# Patient Record
Sex: Female | Born: 1945 | Race: White | Hispanic: No | Smoking: Current some day smoker
Health system: Southern US, Community
[De-identification: ages and names within clinical notes are randomized; demographics above are authoritative.]

## PROBLEM LIST (undated history)

## (undated) DIAGNOSIS — K219 Gastro-esophageal reflux disease without esophagitis: Secondary | ICD-10-CM

## (undated) DIAGNOSIS — E785 Hyperlipidemia, unspecified: Secondary | ICD-10-CM

## (undated) DIAGNOSIS — I10 Essential (primary) hypertension: Secondary | ICD-10-CM

## (undated) DIAGNOSIS — J449 Chronic obstructive pulmonary disease, unspecified: Secondary | ICD-10-CM

## (undated) DIAGNOSIS — I2510 Atherosclerotic heart disease of native coronary artery without angina pectoris: Secondary | ICD-10-CM

## (undated) HISTORY — DX: Essential (primary) hypertension: I10

## (undated) HISTORY — DX: Hyperlipidemia, unspecified: E78.5

## (undated) HISTORY — DX: Chronic obstructive pulmonary disease, unspecified: J44.9

## (undated) HISTORY — DX: Gastro-esophageal reflux disease without esophagitis: K21.9

## (undated) HISTORY — DX: Atherosclerotic heart disease of native coronary artery without angina pectoris: I25.10

---

## 1998-01-02 ENCOUNTER — Inpatient Hospital Stay (HOSPITAL_COMMUNITY): Admission: EM | Admit: 1998-01-02 | Discharge: 1998-01-05 | Payer: Self-pay | Admitting: Cardiovascular Disease

## 1999-07-21 ENCOUNTER — Inpatient Hospital Stay (HOSPITAL_COMMUNITY): Admission: EM | Admit: 1999-07-21 | Discharge: 1999-07-24 | Payer: Self-pay | Admitting: Emergency Medicine

## 1999-07-21 ENCOUNTER — Encounter: Payer: Self-pay | Admitting: *Deleted

## 2001-02-12 ENCOUNTER — Ambulatory Visit (HOSPITAL_COMMUNITY): Admission: RE | Admit: 2001-02-12 | Discharge: 2001-02-12 | Payer: Self-pay | Admitting: Cardiology

## 2001-02-22 ENCOUNTER — Ambulatory Visit (HOSPITAL_COMMUNITY): Admission: RE | Admit: 2001-02-22 | Discharge: 2001-02-22 | Payer: Self-pay | Admitting: Internal Medicine

## 2001-02-22 ENCOUNTER — Encounter: Payer: Self-pay | Admitting: Internal Medicine

## 2001-03-08 ENCOUNTER — Ambulatory Visit (HOSPITAL_COMMUNITY): Admission: RE | Admit: 2001-03-08 | Discharge: 2001-03-08 | Payer: Self-pay | Admitting: Family Medicine

## 2001-03-08 ENCOUNTER — Encounter: Payer: Self-pay | Admitting: Family Medicine

## 2001-04-19 ENCOUNTER — Encounter: Admission: RE | Admit: 2001-04-19 | Discharge: 2001-07-18 | Payer: Self-pay | Admitting: Internal Medicine

## 2001-10-31 ENCOUNTER — Ambulatory Visit (HOSPITAL_COMMUNITY): Admission: RE | Admit: 2001-10-31 | Discharge: 2001-10-31 | Payer: Self-pay | Admitting: Family Medicine

## 2001-10-31 ENCOUNTER — Encounter: Payer: Self-pay | Admitting: Family Medicine

## 2002-01-18 ENCOUNTER — Ambulatory Visit (HOSPITAL_COMMUNITY): Admission: RE | Admit: 2002-01-18 | Discharge: 2002-01-18 | Payer: Self-pay | Admitting: Family Medicine

## 2002-01-18 ENCOUNTER — Encounter: Payer: Self-pay | Admitting: Family Medicine

## 2002-01-29 ENCOUNTER — Encounter: Payer: Self-pay | Admitting: Internal Medicine

## 2002-01-29 ENCOUNTER — Ambulatory Visit (HOSPITAL_COMMUNITY): Admission: RE | Admit: 2002-01-29 | Discharge: 2002-01-29 | Payer: Self-pay | Admitting: Internal Medicine

## 2002-02-01 ENCOUNTER — Ambulatory Visit (HOSPITAL_COMMUNITY): Admission: RE | Admit: 2002-02-01 | Discharge: 2002-02-01 | Payer: Self-pay | Admitting: Cardiology

## 2002-11-01 ENCOUNTER — Encounter: Payer: Self-pay | Admitting: Family Medicine

## 2002-11-01 ENCOUNTER — Ambulatory Visit (HOSPITAL_COMMUNITY): Admission: RE | Admit: 2002-11-01 | Discharge: 2002-11-01 | Payer: Self-pay | Admitting: Family Medicine

## 2002-12-17 ENCOUNTER — Inpatient Hospital Stay (HOSPITAL_COMMUNITY): Admission: EM | Admit: 2002-12-17 | Discharge: 2002-12-21 | Payer: Self-pay | Admitting: Emergency Medicine

## 2002-12-17 ENCOUNTER — Encounter: Payer: Self-pay | Admitting: Emergency Medicine

## 2003-03-13 ENCOUNTER — Ambulatory Visit (HOSPITAL_COMMUNITY): Admission: RE | Admit: 2003-03-13 | Discharge: 2003-03-13 | Payer: Self-pay | Admitting: Internal Medicine

## 2003-03-13 ENCOUNTER — Encounter: Payer: Self-pay | Admitting: Internal Medicine

## 2003-08-26 ENCOUNTER — Ambulatory Visit (HOSPITAL_COMMUNITY): Admission: RE | Admit: 2003-08-26 | Discharge: 2003-08-26 | Payer: Self-pay | Admitting: Family Medicine

## 2003-09-15 ENCOUNTER — Ambulatory Visit (HOSPITAL_COMMUNITY): Admission: RE | Admit: 2003-09-15 | Discharge: 2003-09-15 | Payer: Self-pay | Admitting: Thoracic Surgery

## 2003-11-19 ENCOUNTER — Encounter: Admission: RE | Admit: 2003-11-19 | Discharge: 2003-11-19 | Payer: Self-pay | Admitting: Thoracic Surgery

## 2004-01-11 ENCOUNTER — Emergency Department (HOSPITAL_COMMUNITY): Admission: EM | Admit: 2004-01-11 | Discharge: 2004-01-11 | Payer: Self-pay | Admitting: Emergency Medicine

## 2004-03-11 ENCOUNTER — Encounter: Admission: RE | Admit: 2004-03-11 | Discharge: 2004-03-11 | Payer: Self-pay | Admitting: Thoracic Surgery

## 2004-07-06 ENCOUNTER — Ambulatory Visit (HOSPITAL_COMMUNITY): Admission: RE | Admit: 2004-07-06 | Discharge: 2004-07-06 | Payer: Self-pay | Admitting: Family Medicine

## 2004-07-08 ENCOUNTER — Encounter: Admission: RE | Admit: 2004-07-08 | Discharge: 2004-07-08 | Payer: Self-pay | Admitting: Thoracic Surgery

## 2004-07-13 IMAGING — CT NM MISC PROCEDURE
3 series · 25 of 25 positions shown · IV contrast ([ID])
Comparison: No prior PET.  The CT chest from [HOSPITAL] 08/26/03 is correlated.

CLINICAL DATA: 57-year-old with a recent CT of the chest demonstrating a medial anterior right upper lobe lung nodule and slightly prominent right hilar lymph node.
([id number] for Medicare)

FDG PET-CT TUMOR IMAGING (SKULL BASE TO THIGHS)
TECHNIQUE: 16.7 mCi F-18 FDG were administered via a right hand vein.  Full ring PET imaging was performed from the skull base through the mid-thighs 70 minutes after injection.  CT data was obtained and used for attenuation correction and anatomic localization only.  (This was not acquired as a diagnostic CT examination.)

[Series 1: pet ac · axial · 3.3mm · 4.69mm/px · z∈[-880,-10]mm · 9 of 267 slices shown]
[im 1/267]
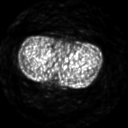
[im 34/267]
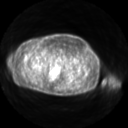
[im 67/267]
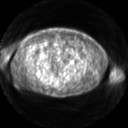
[im 100/267]
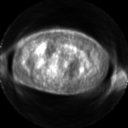
[im 134/267]
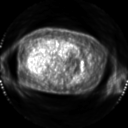
[im 167/267]
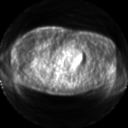
[im 200/267]
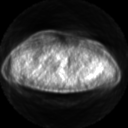
[im 233/267]
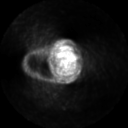
[im 267/267]
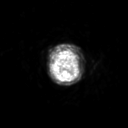

[Series 2: ct images · axial · 3.8mm · 0.98mm/px · z∈[-880,-10]mm · 8 of 267 slices shown]
[im 1/267  soft-tissue]
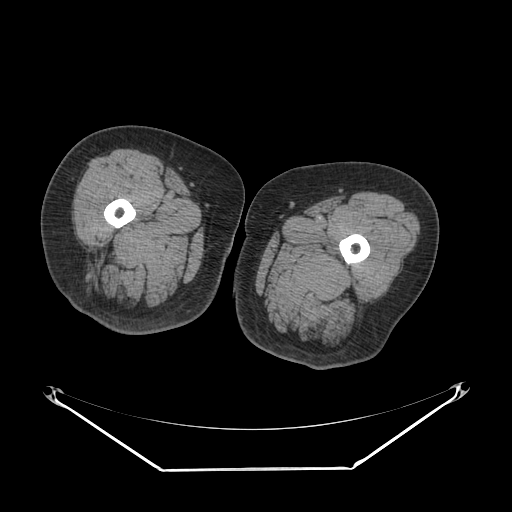
[im 39/267  soft-tissue]
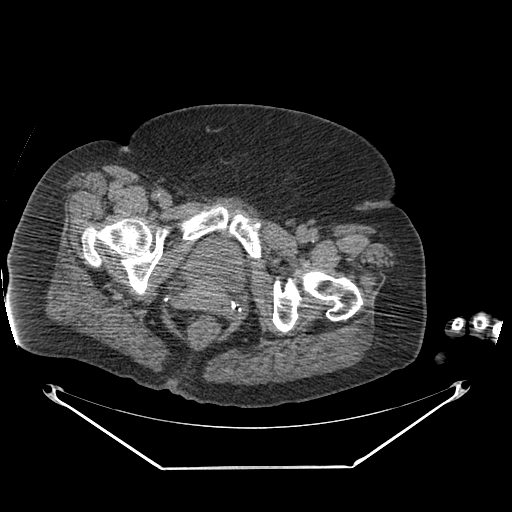
[im 77/267  soft-tissue]
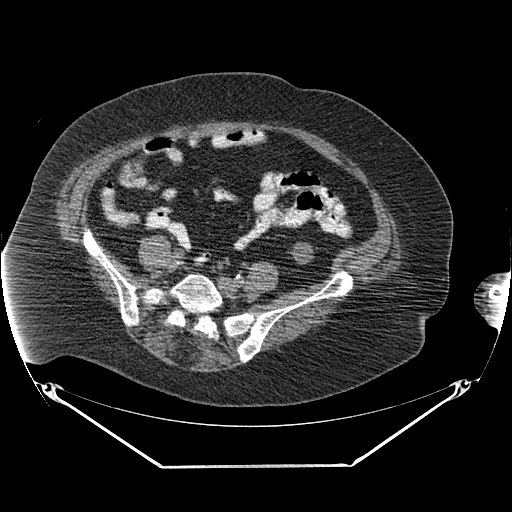
[im 115/267  soft-tissue]
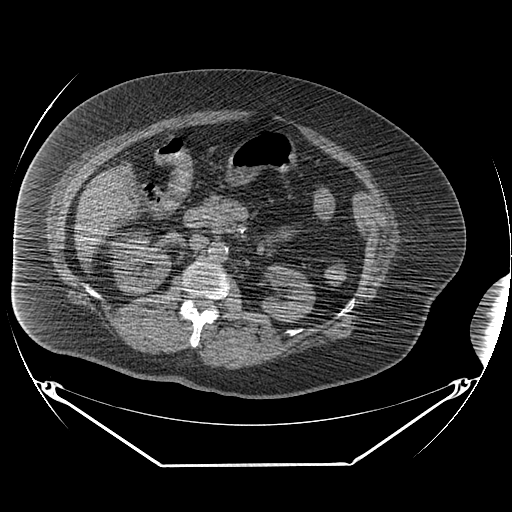
[im 153/267  soft-tissue]
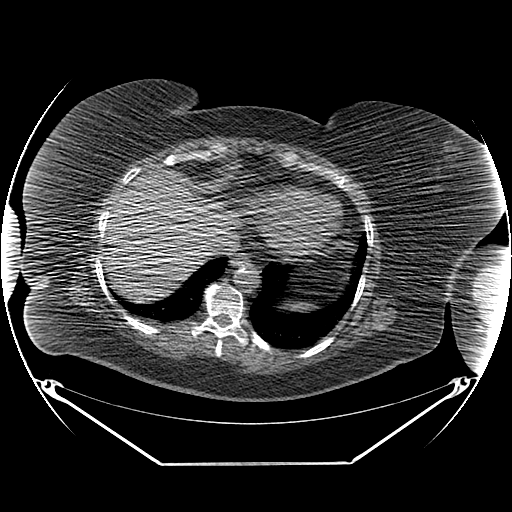
[im 191/267  soft-tissue]
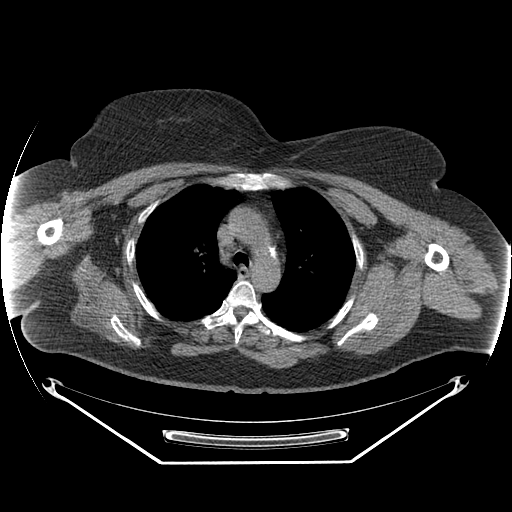
[im 229/267  soft-tissue]
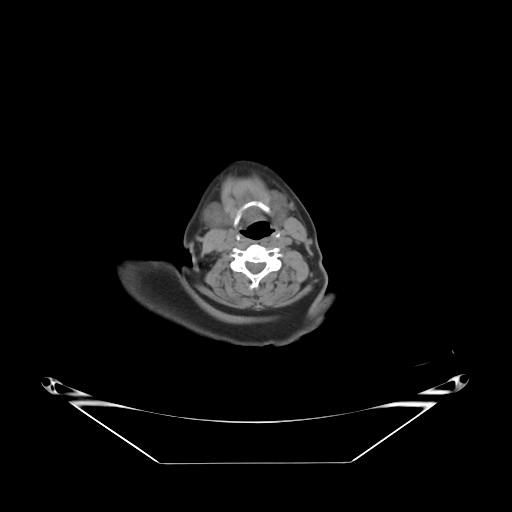
[im 267/267  soft-tissue]
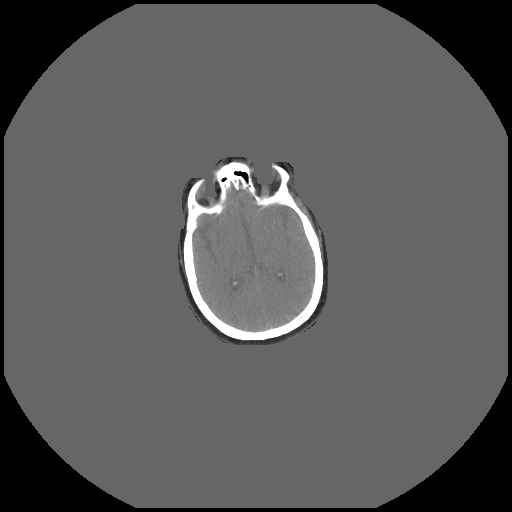

[Series 2: pet nac · axial · 3.3mm · 4.69mm/px · z∈[-880,-10]mm · 8 of 267 slices shown]
[im 1/267]
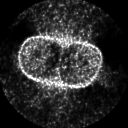
[im 39/267]
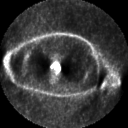
[im 77/267]
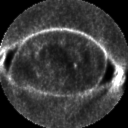
[im 115/267]
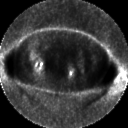
[im 153/267]
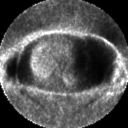
[im 191/267]
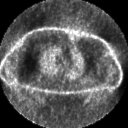
[im 229/267]
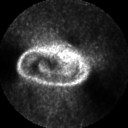
[im 267/267]
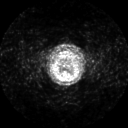

[25 of 25 positions shown; findings below may reference images not displayed]

FINDINGS: The nodule in the extreme anteromedial right upper lobe does not demonstrate increased metabolic activity on the PET examination (maximum SUV 1.3 g/mL).  Similarly, the mildly enlarged right hilar lymph nodes do not demonstrate increased metabolic activity.  No abnormal metabolic activity is identified anywhere in the neck, chest, abdomen, or pelvis.  The expected activity in the luminal contents of the large and small bowel was noted, as is the excretion into the urinary tract.  
IMPRESSION
1.  No abnormal metabolic activity is present within the right upper lobe lung nodule or the mildly enlarged right hilar lymph nodes identified on the recent CT examination.  This would be consistent with a benign etiology.  Follow-up chest CT in approximately six months would be suggested for confirmation.
2.  Normal FDG PET examination.

## 2005-05-16 ENCOUNTER — Ambulatory Visit (HOSPITAL_COMMUNITY): Admission: RE | Admit: 2005-05-16 | Discharge: 2005-05-16 | Payer: Self-pay | Admitting: Family Medicine

## 2005-06-28 ENCOUNTER — Ambulatory Visit (HOSPITAL_COMMUNITY): Admission: RE | Admit: 2005-06-28 | Discharge: 2005-06-28 | Payer: Self-pay | Admitting: Family Medicine

## 2005-07-15 ENCOUNTER — Ambulatory Visit: Payer: Self-pay | Admitting: Internal Medicine

## 2005-08-02 ENCOUNTER — Ambulatory Visit: Payer: Self-pay | Admitting: Internal Medicine

## 2005-08-08 ENCOUNTER — Ambulatory Visit (HOSPITAL_COMMUNITY): Admission: RE | Admit: 2005-08-08 | Discharge: 2005-08-08 | Payer: Self-pay | Admitting: Internal Medicine

## 2005-08-24 ENCOUNTER — Encounter (HOSPITAL_COMMUNITY): Admission: RE | Admit: 2005-08-24 | Discharge: 2005-08-28 | Payer: Self-pay | Admitting: Internal Medicine

## 2005-08-29 ENCOUNTER — Ambulatory Visit (HOSPITAL_COMMUNITY): Admission: RE | Admit: 2005-08-29 | Discharge: 2005-08-29 | Payer: Self-pay | Admitting: Internal Medicine

## 2005-11-01 ENCOUNTER — Ambulatory Visit: Payer: Self-pay | Admitting: Internal Medicine

## 2005-12-22 ENCOUNTER — Ambulatory Visit (HOSPITAL_COMMUNITY): Admission: RE | Admit: 2005-12-22 | Discharge: 2005-12-22 | Payer: Self-pay | Admitting: Family Medicine

## 2006-04-25 ENCOUNTER — Ambulatory Visit: Payer: Self-pay | Admitting: Cardiology

## 2006-05-17 ENCOUNTER — Ambulatory Visit: Payer: Self-pay | Admitting: Cardiology

## 2006-09-11 ENCOUNTER — Ambulatory Visit: Payer: Self-pay | Admitting: Cardiology

## 2006-10-03 ENCOUNTER — Inpatient Hospital Stay (HOSPITAL_COMMUNITY): Admission: EM | Admit: 2006-10-03 | Discharge: 2006-10-10 | Payer: Self-pay | Admitting: Emergency Medicine

## 2006-10-05 ENCOUNTER — Ambulatory Visit: Payer: Self-pay | Admitting: Internal Medicine

## 2006-11-18 ENCOUNTER — Observation Stay (HOSPITAL_COMMUNITY): Admission: EM | Admit: 2006-11-18 | Discharge: 2006-11-21 | Payer: Self-pay | Admitting: Emergency Medicine

## 2006-11-18 ENCOUNTER — Ambulatory Visit: Payer: Self-pay | Admitting: Internal Medicine

## 2006-12-13 ENCOUNTER — Ambulatory Visit: Payer: Self-pay | Admitting: Cardiovascular Disease

## 2007-01-15 ENCOUNTER — Ambulatory Visit: Payer: Self-pay | Admitting: Cardiovascular Disease

## 2007-01-16 LAB — CONVERTED CEMR LAB
ALT: 14 units/L (ref 0–40)
AST: 19 units/L (ref 0–37)
Alkaline Phosphatase: 66 units/L (ref 39–117)
Direct LDL: 110.6 mg/dL
HDL: 38.9 mg/dL — ABNORMAL LOW (ref >39.0)
Total Bilirubin: 0.4 mg/dL (ref 0.3–1.2)
Total Protein: 6.5 g/dL (ref 6.0–8.3)
VLDL: 57 mg/dL — ABNORMAL HIGH (ref 0–40)

## 2007-06-26 ENCOUNTER — Ambulatory Visit (HOSPITAL_COMMUNITY): Admission: RE | Admit: 2007-06-26 | Discharge: 2007-06-26 | Payer: Self-pay | Admitting: Family Medicine

## 2007-07-16 ENCOUNTER — Ambulatory Visit: Payer: Self-pay | Admitting: Cardiovascular Disease

## 2007-08-28 ENCOUNTER — Emergency Department (HOSPITAL_COMMUNITY): Admission: EM | Admit: 2007-08-28 | Discharge: 2007-08-28 | Payer: Self-pay | Admitting: Emergency Medicine

## 2007-09-03 ENCOUNTER — Ambulatory Visit (HOSPITAL_COMMUNITY): Admission: RE | Admit: 2007-09-03 | Discharge: 2007-09-03 | Payer: Self-pay | Admitting: Family Medicine

## 2008-09-22 DIAGNOSIS — E785 Hyperlipidemia, unspecified: Secondary | ICD-10-CM

## 2008-09-22 DIAGNOSIS — I10 Essential (primary) hypertension: Secondary | ICD-10-CM | POA: Insufficient documentation

## 2009-05-08 ENCOUNTER — Encounter (INDEPENDENT_AMBULATORY_CARE_PROVIDER_SITE_OTHER): Payer: Self-pay | Admitting: *Deleted

## 2009-08-14 ENCOUNTER — Encounter (INDEPENDENT_AMBULATORY_CARE_PROVIDER_SITE_OTHER): Payer: Self-pay | Admitting: *Deleted

## 2009-08-29 HISTORY — PX: CORONARY ARTERY BYPASS GRAFT: SHX141

## 2009-11-10 ENCOUNTER — Emergency Department (HOSPITAL_COMMUNITY): Admission: EM | Admit: 2009-11-10 | Discharge: 2009-11-10 | Payer: Self-pay | Admitting: Emergency Medicine

## 2009-11-27 ENCOUNTER — Ambulatory Visit: Payer: Self-pay | Admitting: Cardiovascular Disease

## 2009-12-01 ENCOUNTER — Encounter: Payer: Self-pay | Admitting: Cardiovascular Disease

## 2009-12-02 ENCOUNTER — Ambulatory Visit: Payer: Self-pay | Admitting: Vascular Surgery

## 2009-12-02 ENCOUNTER — Encounter: Payer: Self-pay | Admitting: Cardiothoracic Surgery

## 2009-12-02 ENCOUNTER — Inpatient Hospital Stay (HOSPITAL_COMMUNITY): Admission: RE | Admit: 2009-12-02 | Discharge: 2009-12-14 | Payer: Self-pay | Admitting: Cardiovascular Disease

## 2009-12-02 ENCOUNTER — Ambulatory Visit: Payer: Self-pay | Admitting: Cardiovascular Disease

## 2009-12-03 ENCOUNTER — Ambulatory Visit: Payer: Self-pay | Admitting: Cardiothoracic Surgery

## 2010-01-06 ENCOUNTER — Encounter: Admission: RE | Admit: 2010-01-06 | Discharge: 2010-01-06 | Payer: Self-pay | Admitting: Cardiothoracic Surgery

## 2010-01-06 ENCOUNTER — Ambulatory Visit: Payer: Self-pay | Admitting: Cardiothoracic Surgery

## 2010-01-06 ENCOUNTER — Ambulatory Visit: Payer: Self-pay | Admitting: Cardiovascular Disease

## 2010-01-18 DIAGNOSIS — I4891 Unspecified atrial fibrillation: Secondary | ICD-10-CM | POA: Insufficient documentation

## 2010-02-15 ENCOUNTER — Ambulatory Visit: Payer: Self-pay | Admitting: Cardiovascular Disease

## 2010-03-05 ENCOUNTER — Telehealth: Payer: Self-pay | Admitting: Cardiovascular Disease

## 2010-03-05 ENCOUNTER — Encounter: Payer: Self-pay | Admitting: Cardiovascular Disease

## 2010-03-09 ENCOUNTER — Encounter: Payer: Self-pay | Admitting: Cardiovascular Disease

## 2010-03-11 ENCOUNTER — Telehealth: Payer: Self-pay | Admitting: Cardiovascular Disease

## 2010-03-12 ENCOUNTER — Encounter: Payer: Self-pay | Admitting: Cardiovascular Disease

## 2010-03-12 LAB — CONVERTED CEMR LAB
Chloride: 108 meq/L (ref 96–112)
Creatinine, Ser: 0.68 mg/dL (ref 0.40–1.20)
Glucose, Bld: 116 mg/dL — ABNORMAL HIGH (ref 70–99)
Potassium: 5.2 meq/L (ref 3.5–5.3)
Sodium: 144 meq/L (ref 135–145)

## 2010-03-18 ENCOUNTER — Telehealth: Payer: Self-pay | Admitting: Cardiovascular Disease

## 2010-04-27 ENCOUNTER — Encounter: Payer: Self-pay | Admitting: Cardiovascular Disease

## 2010-04-29 LAB — CONVERTED CEMR LAB
BUN: 12 mg/dL (ref 6–23)
Calcium: 9.2 mg/dL (ref 8.4–10.5)
Creatinine, Ser: 0.67 mg/dL (ref 0.40–1.20)
Glucose, Bld: 98 mg/dL (ref 70–99)

## 2010-05-17 ENCOUNTER — Ambulatory Visit: Payer: Self-pay | Admitting: Cardiovascular Disease

## 2010-05-17 DIAGNOSIS — I25810 Atherosclerosis of coronary artery bypass graft(s) without angina pectoris: Secondary | ICD-10-CM

## 2010-05-19 ENCOUNTER — Encounter: Payer: Self-pay | Admitting: Cardiovascular Disease

## 2010-05-19 LAB — CONVERTED CEMR LAB
AST: 23 units/L (ref 0–37)
Albumin: 4 g/dL (ref 3.5–5.2)
Alkaline Phosphatase: 57 units/L (ref 39–117)
Direct LDL: 98.1 mg/dL
Total Bilirubin: 0.3 mg/dL (ref 0.3–1.2)
Total CHOL/HDL Ratio: 4
Triglycerides: 251 mg/dL — ABNORMAL HIGH (ref 0.0–149.0)
VLDL: 50.2 mg/dL — ABNORMAL HIGH (ref 0.0–40.0)

## 2010-07-19 ENCOUNTER — Encounter: Payer: Self-pay | Admitting: Cardiovascular Disease

## 2010-07-19 ENCOUNTER — Ambulatory Visit: Payer: Self-pay | Admitting: Cardiovascular Disease

## 2010-07-26 LAB — CONVERTED CEMR LAB
CO2: 30 meq/L (ref 19–32)
Chloride: 104 meq/L (ref 96–112)
Creatinine, Ser: 0.9 mg/dL (ref 0.4–1.2)
Potassium: 3.9 meq/L (ref 3.5–5.1)

## 2010-09-26 LAB — CONVERTED CEMR LAB
ALT: 18 units/L (ref 0–35)
AST: 20 units/L (ref 0–37)
BUN: 13 mg/dL (ref 6–23)
CO2: 26 meq/L (ref 19–32)
Calcium: 9.2 mg/dL (ref 8.4–10.5)
Chloride: 109 meq/L (ref 96–112)
Creatinine, Ser: 0.6 mg/dL (ref 0.4–1.2)
Glucose, Bld: 88 mg/dL (ref 70–99)
Sodium: 142 meq/L (ref 135–145)
Total Protein: 6.8 g/dL (ref 6.0–8.3)
Triglycerides: 306 mg/dL — ABNORMAL HIGH (ref 0.0–149.0)
VLDL: 61.2 mg/dL — ABNORMAL HIGH (ref 0.0–40.0)

## 2010-09-28 NOTE — Assessment & Plan Note (Signed)
Summary: 3 month rov   Visit Type:  Follow-up Primary Provider:  Dr Bea Graff Phillips Odor  CC:  shortness of breath.  History of Present Illness: 65 year-old woman with CAD s/p past PCI procedures and CABG for treatment of severe left main disease. She presented with Class 3 angina and ultimately underwent CABG by Dr Donata Clay in April 2011. Main complaint at present is exertional dyspnea. She denies chest pain. She admits to lower extremity edema. No palps, lightheadedness, or syncope. She has been back on cigarettes for a few months now. She is physically inactive.  Current Medications (verified): 1)  Welchol 625 Mg Tabs (Colesevelam Hcl) .... Take 3 Tablets Two Times A Day 2)  Aspirin 81 Mg Tbec (Aspirin) .... Take One Tablet By Mouth Daily 3)  Metformin Hcl 500 Mg Tabs (Metformin Hcl) .... Take 1 Tablet By Mouth Once A Day 4)  Omeprazole 20 Mg Tbec (Omeprazole) .... Take 1 Tablet By Mouth Once A Day 5)  Nitrostat 0.4 Mg Subl (Nitroglycerin) .Marland Kitchen.. 1 Tablet Under Tongue At Onset of Chest Pain; You May Repeat Every 5 Minutes For Up To 3 Doses. 6)  Metoprolol Tartrate 25 Mg Tabs (Metoprolol Tartrate) .... Take One Tablet By Mouth Twice A Day 7)  Crestor 20 Mg Tabs (Rosuvastatin Calcium) .... Take One Tablet By Mouth Daily. 8)  Losartan Potassium 100 Mg Tabs (Losartan Potassium) .... Take One Tablet By Mouth Once A Day 9)  Loratadine 10 Mg Tabs (Loratadine) .... As Needed  Allergies: No Known Drug Allergies  Past History:  Past medical history reviewed for relevance to current acute and chronic problems.  Past Medical History: CAD s/p MI and prior PCI (bare metal stent RCA and DES in LAD), CABG 2011 for severe left main stenosis. COPD HTN Dyslipidemia GERD Chronic tobacco  Review of Systems       Negative except as per HPI   Vital Signs:  Patient profile:   65 year old female Height:      68 inches Weight:      233 pounds BMI:     35.56 Pulse rate:   97 / minute Resp:     16  per minute BP sitting:   159 / 83  (right arm)  Vitals Entered By: Kem Parkinson (May 17, 2010 8:58 AM)  Serial Vital Signs/Assessments:  Time      Position  BP       Pulse  Resp  Temp     By           R Arm     159/83                         Kimalexis Barnes           L Arm     150/87                         Kimalexis Barnes   Physical Exam  General:  Pt is alert and oriented, obese woman, in no acute distress, strong smell of cigarettes HEENT: normal Neck: normal carotid upstrokes without bruits, JVP normal Lungs: CTA Chest: well-healed sernotomy scar CV: RRR without murmur or gallop Abd: soft, NT, positive BS, no bruit, no organomegaly Ext: trace pretibial edema. peripheral pulses 2+ and equal Skin: warm and dry without rash    Impression & Recommendations:  Problem # 1:  CAD, ARTERY BYPASS GRAFT (ICD-414.04) Pt stable without angina  following recent bypass surgery. Continue current medical therapy as outlined below.  Her updated medication list for this problem includes:    Aspirin 81 Mg Tbec (Aspirin) .Marland Kitchen... Take one tablet by mouth daily    Nitrostat 0.4 Mg Subl (Nitroglycerin) .Marland Kitchen... 1 tablet under tongue at onset of chest pain; you may repeat every 5 minutes for up to 3 doses.    Metoprolol Tartrate 25 Mg Tabs (Metoprolol tartrate) .Marland Kitchen... Take one tablet by mouth twice a day  Problem # 2:  HYPERTENSION, BENIGN (ICD-401.1) Add HCTZ 25 mg daily for better BP control and she should gain secondary benefit for treatment of edema.  Her updated medication list for this problem includes:    Aspirin 81 Mg Tbec (Aspirin) .Marland Kitchen... Take one tablet by mouth daily    Metoprolol Tartrate 25 Mg Tabs (Metoprolol tartrate) .Marland Kitchen... Take one tablet by mouth twice a day    Losartan Potassium 100 Mg Tabs (Losartan potassium) .Marland Kitchen... Take one tablet by mouth once a day    Hydrochlorothiazide 25 Mg Tabs (Hydrochlorothiazide) .Marland Kitchen... Take one tablet by mouth daily.  Orders: TLB-Lipid Panel  (80061-LIPID) TLB-Hepatic/Liver Function Pnl (80076-HEPATIC)  Problem # 3:  HYPERLIPIDEMIA-MIXED (ICD-272.4) Switched to Crestor a few months ago. Need to followup lipids and lft's.  Her updated medication list for this problem includes:    Welchol 625 Mg Tabs (Colesevelam hcl) .Marland Kitchen... Take 3 tablets two times a day    Crestor 20 Mg Tabs (Rosuvastatin calcium) .Marland Kitchen... Take one tablet by mouth daily.  Orders: TLB-Lipid Panel (80061-LIPID) TLB-Hepatic/Liver Function Pnl (80076-HEPATIC)  Patient Instructions: 1)  Your physician recommends that you schedule a follow-up appointment in: 2 MONTHS 2)  Your physician recommends that you have a LIPID and LIVER profile today. 3)  Your physician recommends that you return for lab work in: 2 MONTHS (BMP 414.02, 401.9, 272.0) 4)  Your physician has recommended you make the following change in your medication: START HCTZ 25mg  one by mouth daily Prescriptions: HYDROCHLOROTHIAZIDE 25 MG TABS (HYDROCHLOROTHIAZIDE) Take one tablet by mouth daily.  #30 x 6   Entered by:   Julieta Gutting, RN, BSN   Authorized by:   Norva Karvonen, MD   Signed by:   Julieta Gutting, RN, BSN on 05/17/2010   Method used:   Electronically to        [facility] (retail)       [address]       [place], [place]  [address]       Ph: [phone]       Fax: [phone]   RxID:   [id number]

## 2010-09-28 NOTE — Assessment & Plan Note (Signed)
Summary: 4wk f/u    Visit Type:  Follow-up Primary Provider:  Dr Bea Graff Phillips Odor  CC:  chest soreness- Pt also concerned because she was taken off the majority of her meds.  History of Present Illness: 65 year-old woman with CAD s/p past PCI procedures and recent CABG for treatment of severe left main disease. She presented with Class 3 angina and ultimately underwent CABG by Dr Donata Clay in April 2011. Overall she is doing well. She still has some expected postoperative chest soreness, but no exertional chest pain or tightness. She complains of exertional dyspnea, but this is chronic. She is inactive. No edema, syncope, palps, or other complaints.  Current Medications (verified): 1)  Welchol 625 Mg Tabs (Colesevelam Hcl) .... Take 3 Tablets Two Times A Day 2)  Aspirin 81 Mg Tbec (Aspirin) .... Take One Tablet By Mouth Daily 3)  Metformin Hcl 500 Mg Tabs (Metformin Hcl) .... Take 1 Tablet By Mouth Once A Day 4)  Omeprazole 20 Mg Tbec (Omeprazole) .... Take 1 Tablet By Mouth Once A Day 5)  Stool Softener 100 Mg Caps (Docusate Sodium) .... Take 1 Capsule By Mouth Once A Day 6)  Nitrostat 0.4 Mg Subl (Nitroglycerin) .Marland Kitchen.. 1 Tablet Under Tongue At Onset of Chest Pain; You May Repeat Every 5 Minutes For Up To 3 Doses. 7)  Metoprolol Tartrate 25 Mg Tabs (Metoprolol Tartrate) .... Take One Tablet By Mouth Twice A Day 8)  Pravastatin Sodium 40 Mg Tabs (Pravastatin Sodium) .Marland Kitchen.. 1 Tab Once Daily  Allergies (verified): No Known Drug Allergies  Past History:  Past medical history reviewed for relevance to current acute and chronic problems.  Past Medical History: Reviewed history from 01/06/2010 and no changes required. CAD s/p MI and prior PCI (bare metal stent RCA and DES in LAD), CABG 2011 for severe left main stenosis. COPD HTN Dyslipidemia GERD  Past Surgical History: CABG April 2011  Review of Systems       Negative except as per HPI   Vital Signs:  Patient profile:   65 year  old female Height:      68 inches Weight:      227 pounds Pulse rate:   83 / minute Resp:     16 per minute BP sitting:   179 / 88  (left arm) Cuff size:   regular  Vitals Entered By: Burnett Kanaris, CNA (February 15, 2010 8:40 AM)  Physical Exam  General:  Pt is alert and oriented, obese woman, in no acute distress. HEENT: normal Neck: normal carotid upstrokes without bruits, JVP normal Lungs: CTA Chest: well-healed sernotomy scar CV: RRR without murmur or gallop Abd: soft, NT, positive BS, no bruit, no organomegaly Ext: no clubbing, cyanosis, or edema. peripheral pulses 2+ and equal Skin: warm and dry without rash    EKG  Procedure date:  02/15/2010  Findings:      NSR, possible inferior infarct age undetermined, anterior infarct age-undetermined  Impression & Recommendations:  Problem # 1:  CAD, NATIVE VESSEL (ICD-414.01) Stable following recent CABG without recurrent angina. Continue ASA, Metoprolol. Agree with discontinuation of clopidogrel.  The following medications were removed from the medication list:    Plavix 75 Mg Tabs (Clopidogrel bisulfate) .Marland Kitchen... Take one tablet by mouth daily    Amlodipine Besylate 5 Mg Tabs (Amlodipine besylate) .Marland Kitchen... Take one tablet by mouth daily Her updated medication list for this problem includes:    Aspirin 81 Mg Tbec (Aspirin) .Marland Kitchen... Take one tablet by mouth daily  Nitrostat 0.4 Mg Subl (Nitroglycerin) .Marland Kitchen... 1 tablet under tongue at onset of chest pain; you may repeat every 5 minutes for up to 3 doses.    Metoprolol Tartrate 25 Mg Tabs (Metoprolol tartrate) .Marland Kitchen... Take one tablet by mouth twice a day  Orders: TLB-Lipid Panel (80061-LIPID) TLB-Hepatic/Liver Function Pnl (80076-HEPATIC) TLB-BMP (Basic Metabolic Panel-BMET) (80048-METABOL) EKG w/ Interpretation (93000)  Problem # 2:  HYPERTENSION, BENIGN (ICD-401.1) Pt with CAD and diabetes. Add Losartan 100 mg daily and continue current metoprolol dose. Follow-up with a BMET in 2  weeks at Dr Beatrice Lecher office in [place].  The following medications were removed from the medication list:    Amlodipine Besylate 5 Mg Tabs (Amlodipine besylate) .Marland Kitchen... Take one tablet by mouth daily Her updated medication list for this problem includes:    Aspirin 81 Mg Tbec (Aspirin) .Marland Kitchen... Take one tablet by mouth daily    Metoprolol Tartrate 25 Mg Tabs (Metoprolol tartrate) .Marland Kitchen... Take one tablet by mouth twice a day    Losartan Potassium 100 Mg Tabs (Losartan potassium) .Marland Kitchen... Take one tablet by mouth once a day  Orders: TLB-Lipid Panel (80061-LIPID) TLB-Hepatic/Liver Function Pnl (80076-HEPATIC) TLB-BMP (Basic Metabolic Panel-BMET) (80048-METABOL) EKG w/ Interpretation (93000)  BP today: 179/88 Prior BP: 144/82 (01/06/2010)  Labs Reviewed: Chol: 190 (01/16/2007)   HDL: 38.9 (01/16/2007)   LDL: DEL (01/16/2007)   TG: 284 (01/16/2007)  Problem # 3:  HYPERLIPIDEMIA-MIXED (ICD-272.4)  Check lipids today.   The following medications were removed from the medication list:    Crestor 20 Mg Tabs (Rosuvastatin calcium) .Marland Kitchen... Take one tablet by mouth daily. Her updated medication list for this problem includes:    Welchol 625 Mg Tabs (Colesevelam hcl) .Marland Kitchen... Take 3 tablets two times a day    Pravastatin Sodium 40 Mg Tabs (Pravastatin sodium) .Marland Kitchen... 1 tab once daily  Orders: TLB-Lipid Panel (80061-LIPID) TLB-Hepatic/Liver Function Pnl (80076-HEPATIC) TLB-BMP (Basic Metabolic Panel-BMET) (80048-METABOL) EKG w/ Interpretation (93000)  CHOL: 190 (01/16/2007)   LDL: DEL (01/16/2007)   HDL: 38.9 (01/16/2007)   TG: 284 (01/16/2007)  Patient Instructions: 1)  Your physician recommends that you schedule a follow-up appointment in: 3 MONTHS 2)  Your physician has recommended you make the following change in your medication:  START Losartan 100mg  one tablet daily 3)  Your physician recommends that you have lab work today: LIPID, LIVER and BMP 4)  Please call Cardiac Rehab ([phone]) to  arrange orientation.  5)  Please have a BMP drawn in 2 weeks with your Primary Care Physician. (Order given) Prescriptions: LOSARTAN POTASSIUM 100 MG TABS (LOSARTAN POTASSIUM) take one tablet by mouth once a day  #30 x 6   Entered by:   Julieta Gutting, RN, BSN   Authorized by:   Norva Karvonen, MD   Signed by:   Julieta Gutting, RN, BSN on 02/15/2010   Method used:   Electronically to        [facility] (retail)       [address]       [place], [place]  [address]       Ph: [phone]       Fax: [phone]   RxID:   [id number]

## 2010-09-28 NOTE — Letter (Signed)
Summary: Cardiac Catheterization Instructions- Main Lab  [facility], Main Office  [address]   [place], [place] [address]   Phone: [phone]  Fax: [phone]     11/27/2009 MRN: [id number]  Michelle Daniels [address] [name], [place]  [address]  Dear Michelle Daniels,   You are scheduled for Cardiac Catheterization on Wednesday December 02, 2009               with Dr. Excell Seltzer.  Please arrive at the [facility] at 8:30       a.m. on the day of your procedure.  1. DIET     _X___ Nothing to eat or drink after midnight except your medications with a sip of water.  2. MAKE SURE YOU TAKE YOUR ASPIRIN AND PLAVIX.  3. _X____ DO NOT TAKE these medications before your procedure:      DO NOT TAKE METFORMIN THE MORNING OF PROCEDURE OR 48 HOURS (2 DAYS) AFTER PROCEDURE         __X__ YOU MAY TAKE ALL of your remaining medications with a small amount of water.          4. Plan for one night stay - bring personal belongings (i.e. toothpaste, toothbrush, etc.)  5. Bring a current list of your medications and current insurance cards.  6. Must have a responsible person to drive you home.   7. Someone must be with you for the first 24 hours after you arrive home.  8. Please wear clothes that are easy to get on and off and wear slip-on shoes.  *Special note: Every effort is made to have your procedure done on time.  Occasionally there are emergencies that present themselves at the hospital that may cause delays.  Please be patient if a delay does occur.  If you have any questions after you get home, please call the office at the number listed above.  Julieta Gutting, RN, BSN

## 2010-09-28 NOTE — Assessment & Plan Note (Signed)
Summary: eph/post cabg   Visit Type:  Follow-up Primary Provider:  Dr Bea Graff Phillips Odor  CC:  shortness of breath.  History of Present Illness: 65 year-old woman with CAD s/p past PCI procedures and recent CABG for treatment of severe left main disease. She presented with Class 3 angina and ultimately underwent CABG by Dr Donata Clay. She had an uneventful post-operative recovery and presents today for follow-up evaluation. She states she is doing fine today. The patient denies chest pain, dyspnea, orthopnea, PND, edema, palpitations, lightheadedness, or syncope.   Current Medications (verified): 1)  Plavix 75 Mg Tabs (Clopidogrel Bisulfate) .... Take One Tablet By Mouth Daily 2)  Amlodipine Besylate 5 Mg Tabs (Amlodipine Besylate) .... Take One Tablet By Mouth Daily 3)  Potassium Chloride Crys Cr 20 Meq Cr-Tabs (Potassium Chloride Crys Cr) .... Take One Tablet By Mouth Daily 4)  Welchol 625 Mg Tabs (Colesevelam Hcl) .... Take 3 Tablets Two Times A Day 5)  Crestor 20 Mg Tabs (Rosuvastatin Calcium) .... Take One Tablet By Mouth Daily. 6)  Aspirin 81 Mg Tbec (Aspirin) .... Take One Tablet By Mouth Daily 7)  Metformin Hcl 500 Mg Tabs (Metformin Hcl) .... Take 1 Tablet By Mouth Once A Day 8)  Omeprazole 20 Mg Tbec (Omeprazole) .... Take 1 Tablet By Mouth Once A Day 9)  Stool Softener 100 Mg Caps (Docusate Sodium) .... Take 1 Capsule By Mouth Once A Day 10)  Advair Diskus 250-50 Mcg/dose Aepb (Fluticasone-Salmeterol) .... Two Times A Day As Needed 11)  Tramadol Hcl 50 Mg Tabs (Tramadol Hcl) .... 6 Hrs As Needed 12)  Nitrostat 0.4 Mg Subl (Nitroglycerin) .Marland Kitchen.. 1 Tablet Under Tongue At Onset of Chest Pain; You May Repeat Every 5 Minutes For Up To 3 Doses. 13)  Metoprolol Tartrate 25 Mg Tabs (Metoprolol Tartrate) .... Take One Tablet By Mouth Twice A Day 14)  Amiodarone Hcl 200 Mg Tabs (Amiodarone Hcl) .... Take One Tablet By Mouth Twice A Day 15)  Albuterol Sulfate (2.5 Mg/31ml) 0.083% Nebu (Albuterol  Sulfate) .... Four Times A Day As Needed  Allergies (verified): No Known Drug Allergies  Past History:  Past medical history reviewed for relevance to current acute and chronic problems.  Past Medical History: CAD s/p MI and prior PCI (bare metal stent RCA and DES in LAD), CABG 2011 for severe left main stenosis. COPD HTN Dyslipidemia GERD  Review of Systems       Negative except as per HPI   Vital Signs:  Patient profile:   65 year old female Height:      68 inches Weight:      230 pounds BMI:     35.10 Pulse rate:   62 / minute Resp:     20 per minute BP sitting:   144 / 82  (left arm) Cuff size:   regular  Vitals Entered By: Hardin Negus, RMA (Jan 06, 2010 10:09 AM)  Physical Exam  General:  Pt is alert and oriented, obese woman, in no acute distress. HEENT: normal Neck: normal carotid upstrokes without bruits, JVP normal Lungs: CTA CV: RRR without murmur or gallop Abd: soft, NT, positive BS, no bruit, no organomegaly Ext: no clubbing, cyanosis, or edema. peripheral pulses 2+ and equal Skin: warm and dry without rash    EKG  Procedure date:  01/06/2010  Findings:      NSR, age-indeterminate inferior infarct, cannot rule anterior infarct age undetermined, HR 62 bpm.  Impression & Recommendations:  Problem # 1:  CAD, NATIVE  VESSEL (ICD-414.01) Pt now s/p CABG. She is stable in the early post-operative course. Will continue current medical therapy and f/u clinically in 4 weeks.  Her updated medication list for this problem includes:    Plavix 75 Mg Tabs (Clopidogrel bisulfate) .Marland Kitchen... Take one tablet by mouth daily    Amlodipine Besylate 5 Mg Tabs (Amlodipine besylate) .Marland Kitchen... Take one tablet by mouth daily    Aspirin 81 Mg Tbec (Aspirin) .Marland Kitchen... Take one tablet by mouth daily    Nitrostat 0.4 Mg Subl (Nitroglycerin) .Marland Kitchen... 1 tablet under tongue at onset of chest pain; you may repeat every 5 minutes for up to 3 doses.    Metoprolol Tartrate 25 Mg Tabs  (Metoprolol tartrate) .Marland Kitchen... Take one tablet by mouth twice a day  Orders: EKG w/ Interpretation (93[address])  Problem # 2:  ATRIAL FIBRILLATION (ICD-427.31) Pt had post-op atrial fibrillation, now in sinus rhythm. Would decrease Amiodarone to 200 mg daily, and will plan on discontinuing this med if still in NSR at the time of her f/u in one month.  Her updated medication list for this problem includes:    Plavix 75 Mg Tabs (Clopidogrel bisulfate) .Marland Kitchen... Take one tablet by mouth daily    Aspirin 81 Mg Tbec (Aspirin) .Marland Kitchen... Take one tablet by mouth daily    Metoprolol Tartrate 25 Mg Tabs (Metoprolol tartrate) .Marland Kitchen... Take one tablet by mouth twice a day    Amiodarone Hcl 200 Mg Tabs (Amiodarone hcl) .Marland Kitchen... Take one tablet by mouth daily  Patient Instructions: 1)  Your physician recommends that you schedule a follow-up appointment in: 4 WEEKS 2)  Your physician recommends that you return for a FASTING LIPID, LIVER, BMP, TSH in 4 WEEKS (414.02, 272.0)---Nothing to eat or drink after midnight  3)  Your physician has recommended you make the following change in your medication: DECREASE Amiodarone to 200mg  once a day Prescriptions: NITROSTAT 0.4 MG SUBL (NITROGLYCERIN) 1 tablet under tongue at onset of chest pain; you may repeat every 5 minutes for up to 3 doses.  #25 x 2   Entered by:   Julieta Gutting, RN, BSN   Authorized by:   Norva Karvonen, MD   Signed by:   Julieta Gutting, RN, BSN on 01/06/2010   Method used:   Electronically to        [facility] (retail)       [address]       [place], [place]  [address]       Ph: [phone]       Fax: [phone]   RxID:   [id number]

## 2010-09-28 NOTE — Assessment & Plan Note (Signed)
Summary: ROV   Visit Type:  Follow-up Primary Provider:  Dr Bea Graff Phillips Odor  CC:  Sob.  History of Present Illness: 65 year-old woman with CAD s/p past PCI procedures and CABG for treatment of severe left main disease. She presented with Class 3 angina and ultimately underwent CABG by Dr Donata Clay in April 2011.   The patient complains of chronic exertional dyspnea, unchanged over time. She continues to smoke cigarettes. She has episodic chest pains, but not related to exertion. No orthopnea or PND. Complains of leg swelling right worse than left.  Current Medications (verified): 1)  Welchol 625 Mg Tabs (Colesevelam Hcl) .... Take 3 Tablets Two Times A Day 2)  Aspirin 81 Mg Tbec (Aspirin) .... Take One Tablet By Mouth Daily 3)  Metformin Hcl 500 Mg Tabs (Metformin Hcl) .... Take 1 Tablet By Mouth Once A Day 4)  Omeprazole 20 Mg Tbec (Omeprazole) .... Take 1 Tablet By Mouth Once A Day 5)  Nitrostat 0.4 Mg Subl (Nitroglycerin) .Marland Kitchen.. 1 Tablet Under Tongue At Onset of Chest Pain; You May Repeat Every 5 Minutes For Up To 3 Doses. 6)  Metoprolol Tartrate 25 Mg Tabs (Metoprolol Tartrate) .... Take One Tablet By Mouth Twice A Day 7)  Crestor 20 Mg Tabs (Rosuvastatin Calcium) .... Take One Tablet By Mouth Daily. 8)  Losartan Potassium 100 Mg Tabs (Losartan Potassium) .... Take One Tablet By Mouth Once A Day 9)  Hydrochlorothiazide 25 Mg Tabs (Hydrochlorothiazide) .... Take One Tablet By Mouth Daily.  Allergies (verified): No Known Drug Allergies  Past History:  Past medical history reviewed for relevance to current acute and chronic problems.  Past Medical History: Reviewed history from 05/17/2010 and no changes required. CAD s/p MI and prior PCI (bare metal stent RCA and DES in LAD), CABG 2011 for severe left main stenosis. COPD HTN Dyslipidemia GERD Chronic tobacco  Social History: Chronically-ill, sedentery Disabled Heavy tobacco history, continues to smoke 2011 No  ETOH  Review of Systems       Positive for poor sleep, otherwise negative except as per HPI   Vital Signs:  Patient profile:   65 year old female Height:      68 inches Weight:      232.50 pounds BMI:     35.48 Pulse rate:   79 / minute Pulse rhythm:   regular Resp:     28 per minute BP sitting:   112 / 70  (left arm) Cuff size:   large  Vitals Entered By: Vikki Ports (July 19, 2010 9:51 AM)  Physical Exam  General:  Pt is alert and oriented, obese woman, in no acute distress HEENT: normal Neck: normal carotid upstrokes without bruits, JVP normal Lungs: CTA Chest: well-healed sernotomy scar CV: RRR without murmur or gallop Abd: soft, NT, positive BS, no bruit, no organomegaly Ext: trace pretibial edema R>L. peripheral pulses 2+ and equal Skin: warm and dry without rash    EKG  Procedure date:  07/19/2010  Findings:      NSR, age-indeterminate inferior infarct, 79 bpm.  Impression & Recommendations:  Problem # 1:  CAD, ARTERY BYPASS GRAFT (ICD-414.04) Pt stable without angina. Continue current medical program. Discussed the importance of weight loss, enrollment in cardiac rehab, and tobacco cessation.  Her updated medication list for this problem includes:    Aspirin 81 Mg Tbec (Aspirin) .Marland Kitchen... Take one tablet by mouth daily    Nitrostat 0.4 Mg Subl (Nitroglycerin) .Marland Kitchen... 1 tablet under tongue at onset of chest pain;  you may repeat every 5 minutes for up to 3 doses.    Metoprolol Tartrate 25 Mg Tabs (Metoprolol tartrate) .Marland Kitchen... Take one tablet by mouth twice a day  Her updated medication list for this problem includes:    Aspirin 81 Mg Tbec (Aspirin) .Marland Kitchen... Take one tablet by mouth daily    Nitrostat 0.4 Mg Subl (Nitroglycerin) .Marland Kitchen... 1 tablet under tongue at onset of chest pain; you may repeat every 5 minutes for up to 3 doses.    Metoprolol Tartrate 25 Mg Tabs (Metoprolol tartrate) .Marland Kitchen... Take one tablet by mouth twice a day  Orders: EKG w/ Interpretation  (93000)  Problem # 2:  HYPERTENSION, BENIGN (ICD-401.1)  BP much better controlled after addition of HCTZ. Check BMET today.  Her updated medication list for this problem includes:    Aspirin 81 Mg Tbec (Aspirin) .Marland Kitchen... Take one tablet by mouth daily    Metoprolol Tartrate 25 Mg Tabs (Metoprolol tartrate) .Marland Kitchen... Take one tablet by mouth twice a day    Losartan Potassium 100 Mg Tabs (Losartan potassium) .Marland Kitchen... Take one tablet by mouth once a day    Hydrochlorothiazide 25 Mg Tabs (Hydrochlorothiazide) .Marland Kitchen... Take one tablet by mouth daily.  Orders: EKG w/ Interpretation (93000)  BP today: 112/70 Prior BP: 159/83 (05/17/2010)  Labs Reviewed: K+: 4.2 (04/27/2010) Creat: : 0.67 (04/27/2010)   Chol: 167 (05/17/2010)   HDL: 45.10 (05/17/2010)   LDL: DEL (01/16/2007)   TG: 251.0 (05/17/2010)  Problem # 3:  HYPERLIPIDEMIA-MIXED (ICD-272.4)  Continue current therapy.   Her updated medication list for this problem includes:    Welchol 625 Mg Tabs (Colesevelam hcl) .Marland Kitchen... Take 3 tablets two times a day    Crestor 20 Mg Tabs (Rosuvastatin calcium) .Marland Kitchen... Take one tablet by mouth daily.  CHOL: 167 (05/17/2010)   LDL: DEL (01/16/2007)   HDL: 45.10 (05/17/2010)   TG: 251.0 (05/17/2010)   LDL 98  Orders: EKG w/ Interpretation (93000)  Patient Instructions: 1)  Your physician wants you to follow-up in: 6 MONTHS.  You will receive a reminder letter in the mail two months in advance. If you don't receive a letter, please call our office to schedule the follow-up appointment. 2)  Your physician recommends that you continue on your current medications as directed. Please refer to the Current Medication list given to you today.

## 2010-09-28 NOTE — Consult Note (Signed)
Summary: Physicians Order  Physicians Order   Imported By: Earl Many 12/03/2009 16:46:22  _____________________________________________________________________  External Attachment:    Type:   Image     Comment:   External Document

## 2010-09-28 NOTE — Progress Notes (Signed)
Summary: [facility]  [facility]   Imported By: Debby Freiberg 02/19/2010 16:56:27  _____________________________________________________________________  External Attachment:    Type:   Image     Comment:   External Document

## 2010-09-28 NOTE — Letter (Signed)
Summary: [place] Medicaid Prior Authorization  [place] Medicaid Prior Authorization   Imported By: Marylou Mccoy 03/26/2010 11:45:56  _____________________________________________________________________  External Attachment:    Type:   Image     Comment:   External Document

## 2010-09-28 NOTE — Letter (Signed)
Summary: [facility]  [facility]   Imported By: Marylou Mccoy 04/02/2010 14:32:16  _____________________________________________________________________  External Attachment:    Type:   Image     Comment:   External Document

## 2010-09-28 NOTE — Progress Notes (Signed)
Summary: Question about starting Crestor  Phone Note Call from Patient Call back at [phone]   Caller: Daughter/Pasty Summary of Call: Questions about pt starting Crestor Initial call taken by: Judie Grieve,  March 11, 2010 1:35 PM  Follow-up for Phone Call        Line busy x3 Lisabeth Devoid RN Left message with Patsy's husband to call us back. She is at work. Lisabeth Devoid RN Daughter has returned cal.l  I have called [facility] needs prior approval for Crestor in order for her insurance to pay.  They will fax [place] the form today.   Lisabeth Devoid RN     Appended Document: Question about starting Crestor Medicaid prior authorization form completed and faxed.  Await approval of Crestor.

## 2010-09-28 NOTE — Letter (Signed)
Summary: [facility] - Lipid  [facility], Main Office  [address]   [place], [place] [address]   Phone: [phone]  Fax: [phone]     May 19, 2010 MRN: [id number]   Michelle Daniels [address] [place], [place]  [address]   Dear [name],  We have reviewed your cholesterol results.  They are as follows:     Total Cholesterol:    167 (Desirable: less than 200)       HDL  Cholesterol:     45.10  (Desirable: greater than 40 for men and 50 for women)       LDL Cholesterol:       98.1  (Desirable: less than 100 for low risk and less than 70 for moderate to high risk)       Triglycerides:       251.0  (Desirable: less than 150)  Our recommendations include: Your cholesterol numbers overall are at goal.  Liver function normal.  Please continue your current medications and decrease the carbohydrate (cakes, cookies, pasta, bread and potatoes) intake in your diet.   Call our office at the number listed above if you have any questions.  Lowering your LDL cholesterol is important, but it is only one of a large number of "risk factors" that may indicate that you are at risk for heart disease, stroke or other complications of hardening of the arteries.  Other risk factors include:   A.  Cigarette Smoking* B.  High Blood Pressure* C.  Obesity* D.   Low HDL Cholesterol (see yours above)* E.   Diabetes Mellitus (higher risk if your is uncontrolled) F.  Family history of premature heart disease G.  Previous history of stroke or cardiovascular disease    *These are risk factors YOU HAVE CONTROL OVER.  For more information, visit .  There is now evidence that lowering the TOTAL CHOLESTEROL AND LDL CHOLESTEROL can reduce the risk of heart disease.  The American Heart Association recommends the following guidelines for the treatment of elevated cholesterol:  1.  If there is now current heart disease and less than two risk factors, TOTAL CHOLESTEROL should be less  than 200 and LDL CHOLESTEROL should be less than 100. 2.  If there is current heart disease or two or more risk factors, TOTAL CHOLESTEROL should be less than 200 and LDL CHOLESTEROL should be less than 70.  A diet low in cholesterol, saturated fat, and calories is the cornerstone of treatment for elevated cholesterol.  Cessation of smoking and exercise are also important in the management of elevated cholesterol and preventing vascular disease.  Studies have shown that 30 to 60 minutes of physical activity most days can help lower blood pressure, lower cholesterol, and keep your weight at a healthy level.  Drug therapy is used when cholesterol levels do not respond to therapeutic lifestyle changes (smoking cessation, diet, and exercise) and remains unacceptably high.  If medication is started, it is important to have you levels checked periodically to evaluate the need for further treatment options.  Thank you,    [facility] Team

## 2010-09-28 NOTE — Progress Notes (Signed)
Summary: pt has questions  Phone Note Call from Patient Call back at [phone]   Caller: Daughter / Lura Em Reason for Call: Talk to Nurse, Talk to Doctor Summary of Call: pt daughter was called Monday regarding K+ and pt is on 625mg  each and she takes 6pills a day and she eats banana's so should medication be adjusted or what pls call after that's when she gets off Initial call taken by: Omer Jack,  March 18, 2010 12:18 PM  Follow-up for Phone Call        [facility] to call back Dennis Bast, RN, BSN  March 18, 2010 12:30 PM Pt daughter  returning call Judie Grieve  March 18, 2010 12:58 PM--talked with daughter,Patsy--she states pt is taking apotassium supplement --she is going to get the medication bottle and call me back to let know exactly what the medication is Katina Dung, RN, BSN  March 18, 2010 1:32 PM  PT DFAUGHTER CALLING TO GIVE MED NAME [facility] 625MG  3 PILL AM 3 PILL PM CALL BACK [phone] Judie Grieve  March 18, 2010 1:42 PM  Additional Follow-up for Phone Call Additional follow up Details #1::        LMTCB Lisabeth Devoid RN--LMTCB Katina Dung, RN, BSN  March 18, 2010 3:40 PM   lmtcb Scherrie Bateman, LPN  March 19, 2010 8:41 AM  I spoke with the pt's daughter. I made her aware that Welchol is not a potassium supplement. They will maintain her current medications and recheck labs as scheduled. Additional Follow-up by: Sherri Rad, RN, BSN,  March 19, 2010 4:58 PM

## 2010-09-28 NOTE — Progress Notes (Signed)
Summary: discuss test result over again to dtr. - meds  Phone Note Call from Patient Call back at Home Phone [phone]   Caller: Daughter patsy [phone] Reason for Call: Talk to Nurse Summary of Call: discuss test result over again to dtr. mom didn't understand. also which meds she will be stopping. Initial call taken by: Lorne Skeens,  March 05, 2010 9:50 AM  Follow-up for Phone Call        Left message to call back. Julieta Gutting, RN, BSN  March 05, 2010 10:27 AM  I spoke with the pt's daughter and told her to stop Pravastatin and start Crestor 20mg  for cholesterol management. Rx sent into [facility].  The pt will have labs checked in October.  Follow-up by: Julieta Gutting, RN, BSN,  March 05, 2010 4:47 PM    New/Updated Medications: CRESTOR 20 MG TABS (ROSUVASTATIN CALCIUM) Take one tablet by mouth daily. Prescriptions: CRESTOR 20 MG TABS (ROSUVASTATIN CALCIUM) Take one tablet by mouth daily.  #30 x 6   Entered by:   Julieta Gutting, RN, BSN   Authorized by:   Norva Karvonen, MD   Signed by:   Julieta Gutting, RN, BSN on 03/05/2010   Method used:   Electronically to        [facility]* (retail)       [address]       [place], [place]  [address]       Ph: [phone]       Fax: [phone]   RxID:   [id number]

## 2010-09-28 NOTE — Assessment & Plan Note (Signed)
Summary: rov  Medications Added POTASSIUM CHLORIDE CRYS CR 20 MEQ CR-TABS (POTASSIUM CHLORIDE CRYS CR) Take one tablet by mouth daily HYZAAR 100-25 MG TABS (LOSARTAN POTASSIUM-HCTZ) Take 1 tablet by mouth once a day WELCHOL 625 MG TABS (COLESEVELAM HCL) take 3 tablets two times a day CRESTOR 20 MG TABS (ROSUVASTATIN CALCIUM) Take one tablet by mouth daily. ASPIRIN 81 MG TBEC (ASPIRIN) Take one tablet by mouth daily METFORMIN HCL 500 MG TABS (METFORMIN HCL) Take 1 tablet by mouth once a day OMEPRAZOLE 20 MG TBEC (OMEPRAZOLE) Take 1 tablet by mouth once a day STOOL SOFTENER 100 MG CAPS (DOCUSATE SODIUM) Take 1 capsule by mouth once a day ADVAIR DISKUS 250-50 MCG/DOSE AEPB (FLUTICASONE-SALMETEROL) two times a day as needed      Allergies Added: NKDA  Visit Type:  Follow-up Primary Provider:  Dr Bea Graff Phillips Odor  CC:  Chest pains and Sob.  History of Present Illness: 65 year-old woman with CAD presenting for follow-up evaluation. She has chronic chest pain, described as tightness in the chest radiating down her arms and into her back. Pain occurs at rest and with exertion. Also complains of chronic DOE, unchanged over time. C/O chronic leg swelling as well. She has had a difficult time since the death of her husband last year.  The patient's daughter reports that her chest pain has occurred in an accelerating fashion over the last several months. The patient herself is somewhat of a poor historian and has a difficult time providing this information. The daughter also reports that the patient's symptoms are similar to those preceding her last PCI procedure.    Current Medications (verified): 1)  Plavix 75 Mg Tabs (Clopidogrel Bisulfate) .... Take One Tablet By Mouth Daily 2)  Amlodipine Besylate 5 Mg Tabs (Amlodipine Besylate) .... Take One Tablet By Mouth Daily 3)  Potassium Chloride Crys Cr 20 Meq Cr-Tabs (Potassium Chloride Crys Cr) .... Take One Tablet By Mouth Daily 4)  Hyzaar 100-25  Mg Tabs (Losartan Potassium-Hctz) .... Take 1 Tablet By Mouth Once A Day 5)  Welchol 625 Mg Tabs (Colesevelam Hcl) .... Take 3 Tablets Two Times A Day 6)  Crestor 20 Mg Tabs (Rosuvastatin Calcium) .... Take One Tablet By Mouth Daily. 7)  Aspirin 81 Mg Tbec (Aspirin) .... Take One Tablet By Mouth Daily 8)  Metformin Hcl 500 Mg Tabs (Metformin Hcl) .... Take 1 Tablet By Mouth Once A Day 9)  Omeprazole 20 Mg Tbec (Omeprazole) .... Take 1 Tablet By Mouth Once A Day 10)  Stool Softener 100 Mg Caps (Docusate Sodium) .... Take 1 Capsule By Mouth Once A Day 11)  Advair Diskus 250-50 Mcg/dose Aepb (Fluticasone-Salmeterol) .... Two Times A Day As Needed  Allergies (verified): No Known Drug Allergies  Past History:  Past medical history reviewed for relevance to current acute and chronic problems.  Past Medical History: Reviewed history from 09/22/2008 and no changes required. CAD s/p MI and prior PCI (bare metal stent RCA and DES in LAD) COPD HTN Dyslipidemia GERD  Review of Systems       Negative except as per HPI   Vital Signs:  Patient profile:   65 year old female Height:      68 inches Weight:      238 pounds BMI:     36.32 Pulse rate:   96 / minute Resp:     22 per minute BP sitting:   120 / 62  (left arm) Cuff size:   large  Vitals Entered By: Doree Fudge  Jaramillo (November 27, 2009 3:39 PM)  Physical Exam  General:  Pt is alert and oriented, obese woman, in no acute distress. HEENT: normal Neck: normal carotid upstrokes without bruits, JVP normal Lungs: CTA CV: RRR without murmur or gallop Abd: soft, NT, positive BS, no bruit, no organomegaly Ext: no clubbing, cyanosis, or edema. peripheral pulses 2+ and equal Skin: warm and dry without rash    EKG  Procedure date:  11/27/2009  Findings:      NSR, HR 97 bpm, age-indeterminate inferolateral MI  Impression & Recommendations:  Problem # 1:  CAD, NATIVE VESSEL (ICD-414.01) The patient has chest pain in an accelerating  fashion. Her history is somewhat difficult, but she has multiple risk factors with known CAD and continued tobacco abuse. Her body habitus makes stress testing suboptimal. I have recommended diagnostic cardiac catheterization in the setting of progressive symptoms in this patient with a history of CAD and prior PCI. We also had an extensive discussion regarding risk factor modification, including tobacco cessation.  Risks and indication of the cardiac catheter procedure were reviewed with the patient in detail in the presence of her daughter. They agree to proceed. Her updated medication list for this problem includes:    Plavix 75 Mg Tabs (Clopidogrel bisulfate) .Marland Kitchen... Take one tablet by mouth daily    Amlodipine Besylate 5 Mg Tabs (Amlodipine besylate) .Marland Kitchen... Take one tablet by mouth daily    Aspirin 81 Mg Tbec (Aspirin) .Marland Kitchen... Take one tablet by mouth daily  Problem # 2:  HYPERTENSION, BENIGN (ICD-401.1) Controlled on current medical therapy.  Her updated medication list for this problem includes:    Amlodipine Besylate 5 Mg Tabs (Amlodipine besylate) .Marland Kitchen... Take one tablet by mouth daily    Hyzaar 100-25 Mg Tabs (Losartan potassium-hctz) .Marland Kitchen... Take 1 tablet by mouth once a day    Aspirin 81 Mg Tbec (Aspirin) .Marland Kitchen... Take one tablet by mouth daily  BP today: 120/62  Labs Reviewed: Chol: 190 (01/16/2007)   HDL: 38.9 (01/16/2007)   LDL: DEL (01/16/2007)   TG: 284 (01/16/2007)  Problem # 3:  HYPERLIPIDEMIA-MIXED (ICD-272.4) Lipids followed by the patient's primary care physician. Goal LDL cholesterol is less than 70 mg per deciliter. Her updated medication list for this problem includes:    Welchol 625 Mg Tabs (Colesevelam hcl) .Marland Kitchen... Take 3 tablets two times a day    Crestor 20 Mg Tabs (Rosuvastatin calcium) .Marland Kitchen... Take one tablet by mouth daily.  CHOL: 190 (01/16/2007)   LDL: DEL (01/16/2007)   HDL: 38.9 (01/16/2007)   TG: 284 (01/16/2007)  Other Orders: Cardiac Catheterization (Cardiac  Cath) EKG w/ Interpretation (93000)  Patient Instructions: 1)  Your physician has requested that you have a cardiac catheterization.  Cardiac catheterization is used to diagnose and/or treat various heart conditions. Doctors may recommend this procedure for a number of different reasons. The most common reason is to evaluate chest pain. Chest pain can be a symptom of coronary artery disease (CAD), and cardiac catheterization can show whether plaque is narrowing or blocking your heart's arteries. This procedure is also used to evaluate the valves, as well as measure the blood flow and oxygen levels in different parts of your heart.  For further information please visit [email or web address]  Please follow instruction sheet, as given. 2)  Your physician recommends that you continue on your current medications as directed. Please refer to the Current Medication list given to you today.

## 2010-11-16 LAB — GLUCOSE, CAPILLARY
Glucose-Capillary: 100 mg/dL — ABNORMAL HIGH (ref 70–99)
Glucose-Capillary: 107 mg/dL — ABNORMAL HIGH (ref 70–99)
Glucose-Capillary: 118 mg/dL — ABNORMAL HIGH (ref 70–99)
Glucose-Capillary: 125 mg/dL — ABNORMAL HIGH (ref 70–99)
Glucose-Capillary: 127 mg/dL — ABNORMAL HIGH (ref 70–99)
Glucose-Capillary: 135 mg/dL — ABNORMAL HIGH (ref 70–99)
Glucose-Capillary: 147 mg/dL — ABNORMAL HIGH (ref 70–99)
Glucose-Capillary: 88 mg/dL (ref 70–99)
Glucose-Capillary: 98 mg/dL (ref 70–99)

## 2010-11-16 LAB — BASIC METABOLIC PANEL
BUN: 11 mg/dL (ref 6–23)
BUN: 14 mg/dL (ref 6–23)
CO2: 30 mEq/L (ref 19–32)
CO2: 32 mEq/L (ref 19–32)
Calcium: 8.6 mg/dL (ref 8.4–10.5)
Calcium: 8.7 mg/dL (ref 8.4–10.5)
Chloride: 101 mEq/L (ref 96–112)
Chloride: 95 mEq/L — ABNORMAL LOW (ref 96–112)
Creatinine, Ser: 0.63 mg/dL (ref 0.4–1.2)
Creatinine, Ser: 0.64 mg/dL (ref 0.4–1.2)
GFR calc Af Amer: >60 mL/min (ref >60)
GFR calc Af Amer: >60 mL/min (ref >60)
GFR calc non Af Amer: >60 mL/min (ref >60)
GFR calc non Af Amer: >60 mL/min (ref >60)
Glucose, Bld: 76 mg/dL (ref 70–99)
Glucose, Bld: 92 mg/dL (ref 70–99)
Potassium: 2.9 mEq/L — ABNORMAL LOW (ref 3.5–5.1)
Potassium: 3.5 mEq/L (ref 3.5–5.1)
Sodium: 135 mEq/L (ref 135–145)
Sodium: 139 mEq/L (ref 135–145)

## 2010-11-16 LAB — CBC
HCT: 30 % — ABNORMAL LOW (ref 36.0–46.0)
HCT: 32.3 % — ABNORMAL LOW (ref 36.0–46.0)
Hemoglobin: 10.6 g/dL — ABNORMAL LOW (ref 12.0–15.0)
Hemoglobin: 11.3 g/dL — ABNORMAL LOW (ref 12.0–15.0)
MCHC: 34.9 g/dL (ref 30.0–36.0)
MCHC: 35.3 g/dL (ref 30.0–36.0)
MCV: 95.2 fL (ref 78.0–100.0)
MCV: 95.5 fL (ref 78.0–100.0)
Platelets: 182 10*3/uL (ref 150–400)
Platelets: 255 10*3/uL (ref 150–400)
RBC: 3.15 MIL/uL — ABNORMAL LOW (ref 3.87–5.11)
RBC: 3.4 MIL/uL — ABNORMAL LOW (ref 3.87–5.11)
RDW: 15.2 % (ref 11.5–15.5)
RDW: 15.4 % (ref 11.5–15.5)
WBC: 10.7 10*3/uL — ABNORMAL HIGH (ref 4.0–10.5)
WBC: 12.2 10*3/uL — ABNORMAL HIGH (ref 4.0–10.5)

## 2010-11-17 LAB — BASIC METABOLIC PANEL
BUN: 12 mg/dL (ref 6–23)
BUN: 20 mg/dL (ref 6–23)
BUN: 21 mg/dL (ref 6–23)
BUN: 8 mg/dL (ref 6–23)
CO2: 27 mEq/L (ref 19–32)
CO2: 28 mEq/L (ref 19–32)
CO2: 29 mEq/L (ref 19–32)
Calcium: 7.3 mg/dL — ABNORMAL LOW (ref 8.4–10.5)
Calcium: 7.9 mg/dL — ABNORMAL LOW (ref 8.4–10.5)
Calcium: 9.1 mg/dL (ref 8.4–10.5)
Chloride: 106 mEq/L (ref 96–112)
Chloride: 107 mEq/L (ref 96–112)
Chloride: 97 mEq/L (ref 96–112)
Creatinine, Ser: 0.54 mg/dL (ref 0.4–1.2)
Creatinine, Ser: 0.56 mg/dL (ref 0.4–1.2)
Creatinine, Ser: 0.65 mg/dL (ref 0.4–1.2)
Creatinine, Ser: 0.77 mg/dL (ref 0.4–1.2)
GFR calc Af Amer: >60 mL/min (ref >60)
GFR calc Af Amer: >60 mL/min (ref >60)
GFR calc Af Amer: >60 mL/min (ref >60)
GFR calc non Af Amer: >60 mL/min (ref >60)
GFR calc non Af Amer: >60 mL/min (ref >60)
GFR calc non Af Amer: >60 mL/min (ref >60)
GFR calc non Af Amer: >60 mL/min (ref >60)
Glucose, Bld: 115 mg/dL — ABNORMAL HIGH (ref 70–99)
Glucose, Bld: 119 mg/dL — ABNORMAL HIGH (ref 70–99)
Glucose, Bld: 127 mg/dL — ABNORMAL HIGH (ref 70–99)
Glucose, Bld: 87 mg/dL (ref 70–99)
Potassium: 2.9 mEq/L — ABNORMAL LOW (ref 3.5–5.1)
Potassium: 3.4 mEq/L — ABNORMAL LOW (ref 3.5–5.1)
Potassium: 3.9 mEq/L (ref 3.5–5.1)
Potassium: 4.4 mEq/L (ref 3.5–5.1)
Sodium: 133 mEq/L — ABNORMAL LOW (ref 135–145)
Sodium: 139 mEq/L (ref 135–145)
Sodium: 140 mEq/L (ref 135–145)

## 2010-11-17 LAB — POCT I-STAT, CHEM 8
BUN: 16 mg/dL (ref 6–23)
Chloride: 107 mEq/L (ref 96–112)
Creatinine, Ser: 0.6 mg/dL (ref 0.4–1.2)
HCT: 32 % — ABNORMAL LOW (ref 36.0–46.0)
Potassium: 3.4 mEq/L — ABNORMAL LOW (ref 3.5–5.1)
Potassium: 4.4 mEq/L (ref 3.5–5.1)
Sodium: 139 mEq/L (ref 135–145)
Sodium: 140 mEq/L (ref 135–145)

## 2010-11-17 LAB — GLUCOSE, CAPILLARY
Glucose-Capillary: 100 mg/dL — ABNORMAL HIGH (ref 70–99)
Glucose-Capillary: 104 mg/dL — ABNORMAL HIGH (ref 70–99)
Glucose-Capillary: 107 mg/dL — ABNORMAL HIGH (ref 70–99)
Glucose-Capillary: 107 mg/dL — ABNORMAL HIGH (ref 70–99)
Glucose-Capillary: 108 mg/dL — ABNORMAL HIGH (ref 70–99)
Glucose-Capillary: 113 mg/dL — ABNORMAL HIGH (ref 70–99)
Glucose-Capillary: 115 mg/dL — ABNORMAL HIGH (ref 70–99)
Glucose-Capillary: 116 mg/dL — ABNORMAL HIGH (ref 70–99)
Glucose-Capillary: 118 mg/dL — ABNORMAL HIGH (ref 70–99)
Glucose-Capillary: 120 mg/dL — ABNORMAL HIGH (ref 70–99)
Glucose-Capillary: 124 mg/dL — ABNORMAL HIGH (ref 70–99)
Glucose-Capillary: 129 mg/dL — ABNORMAL HIGH (ref 70–99)
Glucose-Capillary: 135 mg/dL — ABNORMAL HIGH (ref 70–99)
Glucose-Capillary: 149 mg/dL — ABNORMAL HIGH (ref 70–99)
Glucose-Capillary: 154 mg/dL — ABNORMAL HIGH (ref 70–99)
Glucose-Capillary: 158 mg/dL — ABNORMAL HIGH (ref 70–99)
Glucose-Capillary: 158 mg/dL — ABNORMAL HIGH (ref 70–99)
Glucose-Capillary: 159 mg/dL — ABNORMAL HIGH (ref 70–99)
Glucose-Capillary: 180 mg/dL — ABNORMAL HIGH (ref 70–99)
Glucose-Capillary: 181 mg/dL — ABNORMAL HIGH (ref 70–99)
Glucose-Capillary: 79 mg/dL (ref 70–99)
Glucose-Capillary: 89 mg/dL (ref 70–99)

## 2010-11-17 LAB — POCT I-STAT 3, ART BLOOD GAS (G3+)
Acid-Base Excess: 1 mmol/L (ref 0.0–2.0)
Acid-Base Excess: 2 mmol/L (ref 0.0–2.0)
Acid-base deficit: 1 mmol/L (ref 0.0–2.0)
Acid-base deficit: 2 mmol/L (ref 0.0–2.0)
Bicarbonate: 23.4 mEq/L (ref 20.0–24.0)
Bicarbonate: 23.6 mEq/L (ref 20.0–24.0)
Bicarbonate: 24.2 mEq/L — ABNORMAL HIGH (ref 20.0–24.0)
Bicarbonate: 24.9 mEq/L — ABNORMAL HIGH (ref 20.0–24.0)
O2 Saturation: 88 %
O2 Saturation: 91 %
O2 Saturation: 92 %
O2 Saturation: 92 %
Patient temperature: 36.5
Patient temperature: 37.3
Patient temperature: 37.6
Patient temperature: 98.3
TCO2: 25 mmol/L (ref 0–100)
TCO2: 25 mmol/L (ref 0–100)
TCO2: 25 mmol/L (ref 0–100)
TCO2: 26 mmol/L (ref 0–100)
TCO2: 26 mmol/L (ref 0–100)
TCO2: 28 mmol/L (ref 0–100)
TCO2: 29 mmol/L (ref 0–100)
pCO2 arterial: 36.4 mmHg (ref 35.0–45.0)
pCO2 arterial: 37.2 mmHg (ref 35.0–45.0)
pCO2 arterial: 37.7 mmHg (ref 35.0–45.0)
pCO2 arterial: 38.3 mmHg (ref 35.0–45.0)
pCO2 arterial: 41.4 mmHg (ref 35.0–45.0)
pCO2 arterial: 44.9 mmHg (ref 35.0–45.0)
pCO2 arterial: 46.5 mmHg — ABNORMAL HIGH (ref 35.0–45.0)
pCO2 arterial: 48.8 mmHg — ABNORMAL HIGH (ref 35.0–45.0)
pCO2 arterial: 48.8 mmHg — ABNORMAL HIGH (ref 35.0–45.0)
pCO2 arterial: 49.6 mmHg — ABNORMAL HIGH (ref 35.0–45.0)
pCO2 arterial: 53.7 mmHg — ABNORMAL HIGH (ref 35.0–45.0)
pH, Arterial: 7.308 — ABNORMAL LOW (ref 7.350–7.400)
pH, Arterial: 7.342 — ABNORMAL LOW (ref 7.350–7.400)
pH, Arterial: 7.387 (ref 7.350–7.400)
pH, Arterial: 7.4 (ref 7.350–7.400)
pH, Arterial: 7.419 — ABNORMAL HIGH (ref 7.350–7.400)
pO2, Arterial: 222 mmHg — ABNORMAL HIGH (ref 80.0–100.0)
pO2, Arterial: 280 mmHg — ABNORMAL HIGH (ref 80.0–100.0)
pO2, Arterial: 61 mmHg — ABNORMAL LOW (ref 80.0–100.0)
pO2, Arterial: 62 mmHg — ABNORMAL LOW (ref 80.0–100.0)
pO2, Arterial: 66 mmHg — ABNORMAL LOW (ref 80.0–100.0)
pO2, Arterial: 66 mmHg — ABNORMAL LOW (ref 80.0–100.0)
pO2, Arterial: 67 mmHg — ABNORMAL LOW (ref 80.0–100.0)
pO2, Arterial: 68 mmHg — ABNORMAL LOW (ref 80.0–100.0)
pO2, Arterial: 80 mmHg (ref 80.0–100.0)
pO2, Arterial: 87 mmHg (ref 80.0–100.0)

## 2010-11-17 LAB — CBC
HCT: 30.5 % — ABNORMAL LOW (ref 36.0–46.0)
HCT: 30.7 % — ABNORMAL LOW (ref 36.0–46.0)
HCT: 30.8 % — ABNORMAL LOW (ref 36.0–46.0)
HCT: 31.2 % — ABNORMAL LOW (ref 36.0–46.0)
HCT: 31.5 % — ABNORMAL LOW (ref 36.0–46.0)
HCT: 32.5 % — ABNORMAL LOW (ref 36.0–46.0)
HCT: 34.6 % — ABNORMAL LOW (ref 36.0–46.0)
HCT: 35.8 % — ABNORMAL LOW (ref 36.0–46.0)
HCT: 38.9 % (ref 36.0–46.0)
Hemoglobin: 10.5 g/dL — ABNORMAL LOW (ref 12.0–15.0)
Hemoglobin: 10.5 g/dL — ABNORMAL LOW (ref 12.0–15.0)
Hemoglobin: 10.5 g/dL — ABNORMAL LOW (ref 12.0–15.0)
Hemoglobin: 10.6 g/dL — ABNORMAL LOW (ref 12.0–15.0)
Hemoglobin: 10.6 g/dL — ABNORMAL LOW (ref 12.0–15.0)
Hemoglobin: 11.3 g/dL — ABNORMAL LOW (ref 12.0–15.0)
Hemoglobin: 11.8 g/dL — ABNORMAL LOW (ref 12.0–15.0)
Hemoglobin: 12.4 g/dL (ref 12.0–15.0)
Hemoglobin: 12.9 g/dL (ref 12.0–15.0)
Hemoglobin: 13.6 g/dL (ref 12.0–15.0)
MCHC: 33.1 g/dL (ref 30.0–36.0)
MCHC: 33.4 g/dL (ref 30.0–36.0)
MCHC: 33.7 g/dL (ref 30.0–36.0)
MCHC: 33.9 g/dL (ref 30.0–36.0)
MCHC: 34.1 g/dL (ref 30.0–36.0)
MCHC: 34.2 g/dL (ref 30.0–36.0)
MCHC: 34.2 g/dL (ref 30.0–36.0)
MCHC: 34.8 g/dL (ref 30.0–36.0)
MCHC: 34.9 g/dL (ref 30.0–36.0)
MCV: 93.4 fL (ref 78.0–100.0)
MCV: 94.5 fL (ref 78.0–100.0)
MCV: 94.5 fL (ref 78.0–100.0)
MCV: 94.6 fL (ref 78.0–100.0)
MCV: 94.8 fL (ref 78.0–100.0)
MCV: 95.2 fL (ref 78.0–100.0)
MCV: 95.3 fL (ref 78.0–100.0)
MCV: 96.1 fL (ref 78.0–100.0)
Platelets: 128 10*3/uL — ABNORMAL LOW (ref 150–400)
Platelets: 150 10*3/uL (ref 150–400)
Platelets: 151 10*3/uL (ref 150–400)
Platelets: 162 10*3/uL (ref 150–400)
Platelets: 163 10*3/uL (ref 150–400)
Platelets: 167 10*3/uL (ref 150–400)
Platelets: 241 10*3/uL (ref 150–400)
Platelets: 260 10*3/uL (ref 150–400)
Platelets: 261 10*3/uL (ref 150–400)
Platelets: 282 10*3/uL (ref 150–400)
RBC: 3.22 MIL/uL — ABNORMAL LOW (ref 3.87–5.11)
RBC: 3.24 MIL/uL — ABNORMAL LOW (ref 3.87–5.11)
RBC: 3.25 MIL/uL — ABNORMAL LOW (ref 3.87–5.11)
RBC: 3.28 MIL/uL — ABNORMAL LOW (ref 3.87–5.11)
RBC: 3.28 MIL/uL — ABNORMAL LOW (ref 3.87–5.11)
RBC: 3.43 MIL/uL — ABNORMAL LOW (ref 3.87–5.11)
RBC: 3.78 MIL/uL — ABNORMAL LOW (ref 3.87–5.11)
RBC: 3.91 MIL/uL (ref 3.87–5.11)
RDW: 14.8 % (ref 11.5–15.5)
RDW: 15.1 % (ref 11.5–15.5)
RDW: 15.3 % (ref 11.5–15.5)
RDW: 15.5 % (ref 11.5–15.5)
RDW: 15.8 % — ABNORMAL HIGH (ref 11.5–15.5)
RDW: 15.8 % — ABNORMAL HIGH (ref 11.5–15.5)
RDW: 15.9 % — ABNORMAL HIGH (ref 11.5–15.5)
RDW: 15.9 % — ABNORMAL HIGH (ref 11.5–15.5)
RDW: 16 % — ABNORMAL HIGH (ref 11.5–15.5)
RDW: 16.2 % — ABNORMAL HIGH (ref 11.5–15.5)
WBC: 11.3 10*3/uL — ABNORMAL HIGH (ref 4.0–10.5)
WBC: 11.4 10*3/uL — ABNORMAL HIGH (ref 4.0–10.5)
WBC: 11.5 10*3/uL — ABNORMAL HIGH (ref 4.0–10.5)
WBC: 12.6 10*3/uL — ABNORMAL HIGH (ref 4.0–10.5)
WBC: 12.7 10*3/uL — ABNORMAL HIGH (ref 4.0–10.5)
WBC: 13.9 10*3/uL — ABNORMAL HIGH (ref 4.0–10.5)
WBC: 14.7 10*3/uL — ABNORMAL HIGH (ref 4.0–10.5)
WBC: 14.8 10*3/uL — ABNORMAL HIGH (ref 4.0–10.5)
WBC: 7.7 10*3/uL (ref 4.0–10.5)

## 2010-11-17 LAB — COMPREHENSIVE METABOLIC PANEL
ALT: 21 U/L (ref 0–35)
ALT: 49 U/L — ABNORMAL HIGH (ref 0–35)
AST: 23 U/L (ref 0–37)
Albumin: 2.8 g/dL — ABNORMAL LOW (ref 3.5–5.2)
Alkaline Phosphatase: 37 U/L — ABNORMAL LOW (ref 39–117)
BUN: 21 mg/dL (ref 6–23)
CO2: 27 mEq/L (ref 19–32)
Calcium: 8.2 mg/dL — ABNORMAL LOW (ref 8.4–10.5)
Calcium: 9.1 mg/dL (ref 8.4–10.5)
Chloride: 105 mEq/L (ref 96–112)
Creatinine, Ser: 0.77 mg/dL (ref 0.4–1.2)
GFR calc Af Amer: >60 mL/min (ref >60)
GFR calc non Af Amer: >60 mL/min (ref >60)
Glucose, Bld: 119 mg/dL — ABNORMAL HIGH (ref 70–99)
Glucose, Bld: 190 mg/dL — ABNORMAL HIGH (ref 70–99)
Potassium: 3.8 mEq/L (ref 3.5–5.1)
Sodium: 138 mEq/L (ref 135–145)
Sodium: 140 mEq/L (ref 135–145)
Total Bilirubin: 0.7 mg/dL (ref 0.3–1.2)
Total Protein: 5.3 g/dL — ABNORMAL LOW (ref 6.0–8.3)
Total Protein: 6.5 g/dL (ref 6.0–8.3)

## 2010-11-17 LAB — POCT I-STAT 4, (NA,K, GLUC, HGB,HCT)
Glucose, Bld: 101 mg/dL — ABNORMAL HIGH (ref 70–99)
Glucose, Bld: 184 mg/dL — ABNORMAL HIGH (ref 70–99)
Glucose, Bld: 85 mg/dL (ref 70–99)
Glucose, Bld: 90 mg/dL (ref 70–99)
Glucose, Bld: 99 mg/dL (ref 70–99)
HCT: 26 % — ABNORMAL LOW (ref 36.0–46.0)
HCT: 27 % — ABNORMAL LOW (ref 36.0–46.0)
HCT: 27 % — ABNORMAL LOW (ref 36.0–46.0)
HCT: 33 % — ABNORMAL LOW (ref 36.0–46.0)
Hemoglobin: 11.2 g/dL — ABNORMAL LOW (ref 12.0–15.0)
Hemoglobin: 8.8 g/dL — ABNORMAL LOW (ref 12.0–15.0)
Hemoglobin: 8.8 g/dL — ABNORMAL LOW (ref 12.0–15.0)
Hemoglobin: 8.8 g/dL — ABNORMAL LOW (ref 12.0–15.0)
Hemoglobin: 9.2 g/dL — ABNORMAL LOW (ref 12.0–15.0)
Potassium: 3.5 mEq/L (ref 3.5–5.1)
Potassium: 3.6 mEq/L (ref 3.5–5.1)
Potassium: 3.9 mEq/L (ref 3.5–5.1)
Potassium: 4.5 mEq/L (ref 3.5–5.1)
Potassium: 5.3 mEq/L — ABNORMAL HIGH (ref 3.5–5.1)
Sodium: 134 mEq/L — ABNORMAL LOW (ref 135–145)
Sodium: 134 mEq/L — ABNORMAL LOW (ref 135–145)
Sodium: 136 mEq/L (ref 135–145)
Sodium: 139 mEq/L (ref 135–145)

## 2010-11-17 LAB — URINE CULTURE: Culture: NO GROWTH

## 2010-11-17 LAB — CULTURE, RESPIRATORY W GRAM STAIN

## 2010-11-17 LAB — ABO/RH: ABO/RH(D): O POS

## 2010-11-17 LAB — CREATININE, SERUM
Creatinine, Ser: 0.58 mg/dL (ref 0.4–1.2)
Creatinine, Ser: 0.65 mg/dL (ref 0.4–1.2)
GFR calc Af Amer: >60 mL/min (ref >60)
GFR calc Af Amer: >60 mL/min (ref >60)
GFR calc non Af Amer: >60 mL/min (ref >60)
GFR calc non Af Amer: >60 mL/min (ref >60)

## 2010-11-17 LAB — PREPARE PLATELETS

## 2010-11-17 LAB — BLOOD GAS, ARTERIAL
Acid-Base Excess: 1.9 mmol/L (ref 0.0–2.0)
Bicarbonate: 26 mEq/L — ABNORMAL HIGH (ref 20.0–24.0)
FIO2: 0.21 %
O2 Saturation: 94.8 %
Patient temperature: 98.6
TCO2: 27.3 mmol/L (ref 0–100)
pCO2 arterial: 41.2 mmHg (ref 35.0–45.0)
pH, Arterial: 7.417 — ABNORMAL HIGH (ref 7.350–7.400)
pO2, Arterial: 68.7 mmHg — ABNORMAL LOW (ref 80.0–100.0)

## 2010-11-17 LAB — MRSA PCR SCREENING: MRSA by PCR: NEGATIVE

## 2010-11-17 LAB — POCT I-STAT 3, VENOUS BLOOD GAS (G3P V)
Bicarbonate: 26.3 mEq/L — ABNORMAL HIGH (ref 20.0–24.0)
pH, Ven: 7.352 — ABNORMAL HIGH (ref 7.250–7.300)
pO2, Ven: 42 mmHg (ref 30.0–45.0)

## 2010-11-17 LAB — PROTIME-INR
INR: 1.21 (ref 0.00–1.49)
Prothrombin Time: 12.4 seconds (ref 11.6–15.2)
Prothrombin Time: 15.2 seconds (ref 11.6–15.2)

## 2010-11-17 LAB — HEMOGLOBIN AND HEMATOCRIT, BLOOD
HCT: 25.4 % — ABNORMAL LOW (ref 36.0–46.0)
Hemoglobin: 8.6 g/dL — ABNORMAL LOW (ref 12.0–15.0)

## 2010-11-17 LAB — CROSSMATCH
ABO/RH(D): O POS
Antibody Screen: NEGATIVE

## 2010-11-17 LAB — URINALYSIS, ROUTINE W REFLEX MICROSCOPIC
Glucose, UA: NEGATIVE mg/dL
Hgb urine dipstick: NEGATIVE
Specific Gravity, Urine: 1.012 (ref 1.005–1.030)

## 2010-11-17 LAB — MAGNESIUM
Magnesium: 2.5 mg/dL (ref 1.5–2.5)
Magnesium: 2.6 mg/dL — ABNORMAL HIGH (ref 1.5–2.5)
Magnesium: 2.8 mg/dL — ABNORMAL HIGH (ref 1.5–2.5)

## 2010-11-17 LAB — HEMOGLOBIN A1C
Hgb A1c MFr Bld: 6.4 % — ABNORMAL HIGH (ref 4.6–6.1)
Mean Plasma Glucose: 137 mg/dL

## 2010-11-17 LAB — APTT: aPTT: 32 seconds (ref 24–37)

## 2010-11-17 LAB — HEPARIN LEVEL (UNFRACTIONATED)
Heparin Unfractionated: 0.49 IU/mL (ref 0.30–0.70)
Heparin Unfractionated: 0.53 IU/mL (ref 0.30–0.70)

## 2010-11-17 LAB — TROPONIN I: Troponin I: <0.01 ng/mL (ref 0.00–0.06)

## 2010-11-19 LAB — DIFFERENTIAL
Basophils Relative: 0 % (ref 0–1)
Lymphs Abs: 2.2 10*3/uL (ref 0.7–4.0)
Monocytes Absolute: 1.9 10*3/uL — ABNORMAL HIGH (ref 0.1–1.0)
Monocytes Relative: 11 % (ref 3–12)
Neutro Abs: 13.2 10*3/uL — ABNORMAL HIGH (ref 1.7–7.7)

## 2010-11-19 LAB — COMPREHENSIVE METABOLIC PANEL
ALT: 13 U/L (ref 0–35)
Albumin: 3.5 g/dL (ref 3.5–5.2)
Alkaline Phosphatase: 60 U/L (ref 39–117)
BUN: 15 mg/dL (ref 6–23)
Calcium: 9.1 mg/dL (ref 8.4–10.5)
Potassium: 2.9 mEq/L — ABNORMAL LOW (ref 3.5–5.1)
Sodium: 137 mEq/L (ref 135–145)
Total Protein: 7.8 g/dL (ref 6.0–8.3)

## 2010-11-19 LAB — POCT CARDIAC MARKERS
CKMB, poc: 1.4 ng/mL (ref 1.0–8.0)
Myoglobin, poc: 194 ng/mL (ref 12–200)

## 2010-11-19 LAB — CBC
MCHC: 34.8 g/dL (ref 30.0–36.0)
Platelets: 293 10*3/uL (ref 150–400)
RDW: 14.2 % (ref 11.5–15.5)

## 2010-12-31 ENCOUNTER — Other Ambulatory Visit: Payer: Self-pay | Admitting: Cardiovascular Disease

## 2011-01-06 ENCOUNTER — Encounter: Payer: Self-pay | Admitting: Cardiovascular Disease

## 2011-01-11 NOTE — Assessment & Plan Note (Signed)
[facility]                            CARDIOLOGY OFFICE NOTE   Michelle Daniels, Michelle Daniels                      MRN:          [id number]  DATE:07/16/2007                            DOB:          1946-03-27    Michelle Daniels returns for follow-up at the [facility] Cardiology office on  July 16, 2007.  Michelle Daniels is a 65 year old woman with coronary  artery disease.  She has had an inferior myocardial infarction that was  treated with stenting back in 1999.  She presented with recurrent angina  and was treated with drug-eluting stent in her LAD by Salvadore Farber,  M.D. in March of this year.  The patient continues to be somewhat  difficult to manage.  She has had a difficult time controlling her blood  pressure.  She continues to smoke cigarettes.  She also complains of  intermittent chest pain.  Her pain is not consistently related to  exertion, but she is fairly sedentary.  She describes pain across the  chest that occurs from time to time.  It is nonradiating.  It feels  similar to her symptoms that she has had in the past.  She has  exertional dyspnea that is stable.  She denies orthopnea, PND, edema,  lightheadedness, syncope, or palpitations.   CURRENT MEDICATIONS:  1. Nexium 40 mg daily.  2. Crestor 20 mg at bedtime.  3. Aspirin 81 mg daily.  4. Potassium 20 mEq daily.  5. Advair 500/50 mcg twice daily.  6. Nebulizer treatments three times daily.  7. Welchol 625 mg three twice daily.  8. Plavix 75 mg daily.  9. Diovan/HCT 320/12.5 mg daily.   ALLERGIES:  No known drug allergies.   PHYSICAL EXAMINATION:  GENERAL:  The patient is alert and oriented.  She  is obese.  She appears older than her stated age.  She is in no acute  distress.  VITAL SIGNS:  Weight is 227, blood pressure 146/84, heart rate 86,  respiratory rate 16.  HEENT:  Normal.  NECK:  Normal carotid upstrokes without bruits.  Jugular venous pressure  is normal.  LUNGS:   Scattered rhonchi.  CARDIOVASCULAR:  Regular rate and rhythm without murmurs or gallops.  ABDOMEN:  Soft, obese, nontender, and no organomegaly.  EXTREMITIES:  No cyanosis, clubbing, or edema.   EKG shows normal sinus rhythm with a heart rate of 88 beats per minute.  There are no significant ST segment or T wave changes.   ASSESSMENT:  1. Coronary artery disease, status post inferior myocardial infarction      and stenting of the right coronary artery as well as unstable      angina and drug-eluting stent treatment of the left anterior      descending.  The patient has recurrent chest pain.  Her chest pain      is very difficult to sort out.  I am going to have her undergo an      Adenosine Myoview stress study.  If there is significant ischemia,      we will plan on repeating a cardiac catheterization.  Otherwise      continue current medical therapy.  2. Hypertension.  The patient continues to have suboptimal blood      pressure control.  Her blood pressure today is only mildly      elevated, but her daughter tells me her blood pressure has been      severely elevated recently with systolic blood pressures over 811      and diastolic blood pressures around 100.  I have taken the liberty      of adding Norvasc 5 mg to her medical regimen.  Ideally I wanted to      try a beta blocker, but she has rhonchi on lung examination and      continues to use albuterol and Atrovent nebulizer treatments and I      thought this would likely lead to worsening pulmonary symptoms.  3. Dyslipidemia.  She is on Crestor 20 mg.  This change was made back      in May and we will plan on repeating lipids and LFT's at the time      of her stress study.   FOLLOWUP:  For follow-up, I would like the patient back in three months.  We will follow up with her as soon as the results of her stress Myoview  study are available.  I counseled her again on tobacco cessation.     Veverly Fells. Excell Seltzer, MD   Electronically Signed    MDC/MedQ  DD: 07/16/2007  DT: 07/17/2007  Job #: [id number]   cc:   Madelin Rear. Sherwood Gambler, MD

## 2011-01-11 NOTE — Assessment & Plan Note (Signed)
OFFICE VISIT   Michelle Daniels, Michelle Daniels  DOB:  Jun 10, 1946                                        Jan 06, 2010  CHART #:  [id number]   CURRENT PROBLEMS:  1. Status post coronary artery bypass graft x3 December 08, 1999 for      class IV unstable angina and left main stenosis.  2. Chronic obstructive pulmonary disease with history of smoking, now      reformed.  3. Obesity.  4. Non-insulin-dependent diabetes mellitus.   PRESENT ILLNESS:  The patient is a 65 year old Caucasian female ex-  smoker returns for first postoperative office visit after undergoing  urgent multivessel coronary bypass grafting in April when she presented  with unstable angina and severe left main three-vessel disease.  At that  time, her EF was approximately 50% and she had had prior history of  coronary disease.  She had had previous stents and was on Plavix.  She  subsequently underwent successful surgical revascularization with left  IMA to the LAD, vein grafts to the diagonal and OM.  She did well  postoperatively.  She did have some transient postoperative atrial  fibrillation, which converted to sinus rhythm on amiodarone.  She has  also been sent home on Crestor, aspirin, metoprolol 25 b.i.d.,  metformin, Prilosec, Plavix, WelChol, and tramadol for pain.  Since  returning home, she has had no recurrent angina and she has not resumed  smoking.  The surgical incisions are healing well and her peripheral  edema has resolved.   PHYSICAL EXAMINATION:  Vital Signs:  Blood pressure 160/80, pulse 60,  respirations 18, and saturation 96%.  General:  She is alert and  responsive.  Lungs:  Breath sounds are clear and equal.  Chest:  The  sternum is stable and well healed.  Cardiac:  Rhythm is regular.  There  is no gallop or murmur.  Extremities:  There is no peripheral edema and  the leg incision is well healed.   DIAGNOSTIC TESTS:  PA and lateral chest x-ray reveals evidence of some  chronic  bronchitis, but no infiltrate, pleural effusion, or densities.  The sternal wires are intact and well aligned.   IMPRESSION AND PLAN:  The patient was told she could resume light  activities, but to not lift more than 15 pounds until July.  She was  told she could stop the amiodarone once the prescription runs out, but  to remain on her antilipid drugs and her metoprolol and aspirin.  She  will return here as needed.   Kerin Perna, M.D.  Electronically Signed   PV/MEDQ  D:  01/06/2010  T:  01/06/2010  Job:  [id number]   cc:   Corrie Mckusick, M.D.  Veverly Fells. Excell Seltzer, MD

## 2011-01-12 ENCOUNTER — Encounter: Payer: Self-pay | Admitting: Cardiovascular Disease

## 2011-01-12 ENCOUNTER — Ambulatory Visit (INDEPENDENT_AMBULATORY_CARE_PROVIDER_SITE_OTHER): Payer: Medicaid Other | Admitting: Cardiovascular Disease

## 2011-01-12 DIAGNOSIS — I4891 Unspecified atrial fibrillation: Secondary | ICD-10-CM

## 2011-01-12 DIAGNOSIS — I25810 Atherosclerosis of coronary artery bypass graft(s) without angina pectoris: Secondary | ICD-10-CM

## 2011-01-12 DIAGNOSIS — E785 Hyperlipidemia, unspecified: Secondary | ICD-10-CM

## 2011-01-12 DIAGNOSIS — I10 Essential (primary) hypertension: Secondary | ICD-10-CM

## 2011-01-12 MED ORDER — AZITHROMYCIN 250 MG PO TABS: 250.0000 mg | ORAL_TABLET | Freq: Every day | ORAL | Status: AC

## 2011-01-12 MED ORDER — COLESEVELAM HCL 625 MG PO TABS: ORAL_TABLET | ORAL | Status: DC

## 2011-01-12 NOTE — Patient Instructions (Signed)
Your physician wants you to follow-up in: 6 MONTHS. You will receive a reminder letter in the mail two months in advance. If you don't receive a letter, please call our office to schedule the follow-up appointment.  Your physician recommends that you continue on your current medications as directed. Please refer to the Current Medication list given to you today. One time prescription given for Zpak.

## 2011-01-14 NOTE — Assessment & Plan Note (Signed)
[facility]                            CARDIOLOGY OFFICE NOTE   Michelle Daniels, Michelle Daniels                      MRN:          [id number]  DATE:12/13/2006                            DOB:          [birth year]    Michelle Daniels was seen as an outpatient at the [facility] cardiology office  on December 13, 2006.  She is a 65 year old woman with longstanding  hypertension and hypercholesterolemia, as well as known coronary artery  disease.  She presented to [facility] in late March complaining  of chest discomfort and was treated for unstable angina.  She underwent  cardiac catheterization on March 24, and underwent stenting of her mid  LAD with a drug eluting stent.  She was discharged home the following  day in stable condition.  Since discharge home, Michelle Daniels has  complained of intermittent chest pain.  Her chest pain was more  pronounced over the first few days of discharge and she took  nitroglycerin for that.  Her symptoms have really improved over the last  three to four days and she has not required any more sublingual  nitroglycerin.  She has had some shortness of breath but this appears to  be improving as well.  Her activity level has increased over the last  few days and, overall, she is feeling better.  She has had no  lightheadedness, syncope, edema, or other complaints.   CURRENT MEDICATIONS:  1. Diovan HCT 160/12.5 mg daily.  2. Potassium 20 mEq daily.  3. Advair 500/50 mcg twice daily.  4. Nebulizer treatments three times daily.  5. Protonix 20 mg twice daily.  6. Welchol 625 mg three b.i.d.  7. Plavix 75 mg daily.  8. Simvastatin 80 mg daily.  9. Advair 500/50 mg daily.   PHYSICAL EXAMINATION:  GENERAL:  She is alert and oriented, in no acute  distress.  VITAL SIGNS:  Her oxygen saturation is 94 percent.  Blood pressure is  140/70.  Heart rate 75.  Weight 227 pounds.  HEENT:  Normal.  NECK:  Normal carotid upstrokes without  bruits.  Jugular venous pressure  is normal.  LUNGS:  Clear to auscultation bilaterally.  HEART:  Regular rate and rhythm without murmurs or gallops.  ABDOMEN:  Soft, nontender.  No organomegaly.  EXTREMITIES:  No cyanosis, clubbing, or edema.  Peripheral pulses are 2+  and equal throughout.   EKG demonstrates normal sinus rhythm with no significant ST segment or T  wave changes.  Cannot rule out age indeterminate inferior MI and cannot  rule out age indeterminate anteroseptal MI.   ASSESSMENT:  Michelle Daniels is currently stable from a cardiac standpoint.  Her current problems are as follows:  Coronary artery disease.  She is  not having any typical angina at this point and her symptoms seem to be  improving over the last several days.  I would recommend continued  aggressive medical therapy at this point with aspirin and clopidogrel  for dual antiplatelet therapy, which she should stay on for the long-  term.  She has prescriptions for both simvastatin, as well  as  rosuvastatin.  I have asked her to resume the rosuvastatin and  discontinue the simvastatin since rosuvastatin is a more potent lipid  lowering agent.  Otherwise, she should continue on her current  medications.   We will plan on followup with Michelle Daniels in approximately three  months, or sooner if any new issues arise.     Veverly Fells. Excell Seltzer, MD  Electronically Signed    MDC/MedQ  DD: 12/15/2006  DT: 12/16/2006  Job #: [id number]

## 2011-01-14 NOTE — H&P (Signed)
NAME:  Michelle Daniels, Michelle Daniels                         ACCOUNT NO.:  [id number]   MEDICAL RECORD NO.:  [id number]                   PATIENT TYPE:  INP   LOCATION:  A201                                 FACILITY:  [facility]   PHYSICIAN:  Hanley Hays. Dechurch, M.D.           DATE OF BIRTH:  04-01-1946   DATE OF ADMISSION:  12/17/2002  DATE OF DISCHARGE:                                HISTORY & PHYSICAL   HISTORY OF PRESENT ILLNESS:  A 65 year old Caucasian female followed by  [facility] (Dr. Sherwood Gambler) who has a long history of chronic  obstructive pulmonary disease, apparently evaluated by Dr. Nelida Gores and  Dr. Donavan Burnet pulmonology who she sees on a yearly basis.  She apparently  has been told she has end-stage chronic obstructive pulmonary disease who  continues to smoke a pack to a pack and a half per day who has had increased  wheezing and shortness of breath over the last several days.  She apparently  had run out of her albuterol nebulizers a week or so ago and presented to  the office today where she received a Solu-Medrol injection, I believe, as  well as a nebulizer treatment and because of continued shortness of breath  she was referred to the emergency room.  Here in the emergency room she  appears to be in moderate respiratory distress with chronic wheezing and  hoarseness which she notes is not an unusual problem for her.  She is with  increased work of breathing but able to answer questions and talk in  sentences.  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  She is hemodynamically stable and she is  being admitted to the hospital though for her severe chronic obstructive  pulmonary disease exacerbation.  In addition the patient gives a history of  fainting spells over the past six months which occur anytime, many times  in church  apparently.  She has never been brought to the emergency room for  evaluation during one of these.  They usually occur while she is standing.  She can be walking about and they will occur suddenly.  she also notes that  her legs go numb and she falls down. She has a history of chronic back  problems apparently had a herniated disk several years ago and since that  time  has had some leg weakness.  She has also noted a 20-30 pound weight  gain in the last years.  She denies any chest pain though she notes some  tightness.  She has chronic edema though no significant  edema today.  Her  blood gas on room air revealed a pO2 of, we think, 85 but could possibly be  65, pH of 7.44, pCO2 of 35.  She is working to maintain  and her saturation  is 95%.   REVIEW OF SYSTEMS:  Patient denies any fever or chills.  She does note some  yellow sputum.  She notes chronic reflux type symptoms and is on a PTI.  She  has had no vomiting.  She has had no changes in her mental status.  No focal  deficits other then what was noted above.  She denies any  GU complaints at  this time.  She complains of being hot.  She actually notes diaphoresis with  any exertion.  She has noted some nosebleeds recently as well.  Remaining  review of systems is negative.   PAST MEDICAL HISTORY:  1. Remarkable for chronic obstructive pulmonary disease, although she has     not been hospitalized in many years.  2. 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  The patient had an echocardiogram in June     which was technically difficult to read though apparently had a fairly     normal  LV function though it was noted to be 39-43% on her previous     imaging studies.  She has never had a cardiac catheterization.  The      patient does not have home O2 but she does have a home neb which she uses     twice daily.  3. Other past medical history includes hypertension.  4. Obesity.  5. History of back surgery presumably secondary to a herniated disk.  6. History of cholecystectomy.  7. Diverticulosis.  8. Cognitive deficit.   ALLERGIES:  None known.   SOCIAL HISTORY:  The patient is married and her husband is considerably  older apparently and in reasonable health.  She has three children who are  very supportive.  She continues to smoke a pack and a half per day.  She  denies any  alcohol use or abuse.  She is disabled secondary to her  respiratory and back problems.   FAMILY HISTORY:  Apparently she had a brother who died suddenly of unknown  cause.  A brother with cancer unknown type.  She has four sisters who are  apparently living, one has diabetes.   PHYSICAL EXAMINATION:  GENERAL:  Reveals an obese white female.  VITAL SIGNS:  Blood pressure is 130/80, pulse 96 and regular, respirations  28-30, temperature is 97.3.  HEENT:  Oropharynx is dry.  It looks like she may have some early thrush.  She is edentulous.  NECK:  Supple.  I can not appreciate any bruits.  The carotids are full.  There is no JVD noted.  LUNGS:  Poor air movement bilaterally and diffuse inspiratory and expiratory  wheezing.  There are a few rales at the left base as well.  HEART:  Distant but regular.  ABDOMEN:  Obese and soft.  EXTREMITIES:  Without clubbing, cyanosis or edema.  She moves all  extremities and follows commands.  Aside from her memory deficit she seems  to be at her baseline according to her daughter.  SKIN:  Without rash, lesion or breakdown.   DIAGNOSTIC STUDIES:  Chest x-ray reveals chronic interstitial changes but no  acute disease.   White count is 10.9 with 79 segs, 17 lymphs.  Sodium 137, potassium 3.8,  glucose 145, CO2 23,  BUN 10, creatinine 0.7.  B-Type natriuretic peptide was less than 30.  CK  55, troponin 0.01.   ASSESSMENT/PLAN:  1. Chronic obstructive pulmonary disease exacerbation.  The patient     continues to use tobacco.  Will admit for pulmonary therapy with Solu-     Medrol empirically to __________ his sputum complaints.  2. Of more concern perhaps is the fact the patient has these syncopal     episodes while standing which may be vasovagal but may be related to     circulatory or other.  These certainly deserve further evaluation.  We     will place the patient on telemetry and I will defer consultation to the     primary attending physicians.  3. Elevated glucose of 145.  We will check a hemoglobin A1-C as this patient     is at high risk for diabetes.  We will do some Accu-Cheks given that fact     I am going to put her on high dose steroids.  A thyroid stimulating     hormone test will be obtained as well as a theophylline level.  4. It should be noted the patient is unable to tell me what medications she     takes on a regular basis.  We have obtained a list from her pharmacy.  5. The patient has no evidence of no acute cardiac ischemia at this time     however, we will get a set of enzymes and monitor and we are also going     to place her on O2.  6. We will share the history of her reflux and chronic hoarseness, question     if this has been further evaluated     wonder whether she needs a promotility agent.  In any event, we will     treat the immediate problems and defer the further evaluations to her     primary attending physician.  7. The plan was discussed with the patient  and the daughter who do have     reasonable understanding.                                                 Hanley Hays Josefine Class, M.D.    FED/MEDQ  D:  12/17/2002  T:  12/17/2002  Job:  [id number]

## 2011-01-14 NOTE — Letter (Signed)
September 11, 2006    Madelin Rear. Sherwood Gambler, MD  [address]  [place], [place] [address]   RE:  Michelle Daniels, Michelle Daniels  MRN:  [id number]  /  DOB:  October 01, 1945   Dear Peyton Najjar,   Ms. Vogler returns to the office for continued evaluation of  hypertension, hyperlipidemia, and coronary artery disease.  Since her  last visit, she has done fairly well.  She initially discontinued  smoking but, now, has resumed due to the stress associated with the  illness in her husband.  She has lost some weight.  She has occasional  minimal episodes of chest discomfort and dyspnea.   CURRENT MEDICATIONS:  Include:  1. Diovan HCT 320/12.5 mg daily.  2. KCl 20 mEq daily.  3. Spiriva b.i.d.  4. Advair b.i.d.  5. Protonix 20 mg b.i.d.  6. Aspirin 81 mg daily.  7. Vytorin 10/40 mg daily.   EXAM:  A pleasant, overweight woman in no acute distress.  The weight is 227, nine pounds less than in September.  Blood pressure  150/80.  Heart rate 85 and regular.  Respirations 16.  NECK:  No jugular venous distention; normal carotid upstrokes without  bruits.  LUNGS:  Minimal expiratory wheezes, anteriorly.  CARDIAC:  Normal distant first and second heart sounds.  ABDOMEN:  Soft and nontender; no organomegaly.  EXTREMITIES:  No edema.   IMPRESSION:  Ms. Borkowski is doing fairly well.  She is once again  encouraged to completely discontinue cigarette smoking both with respect  to her heart and lung disease.  Vaccinations are up to date.  We will  assess a lipid profile and chemistry profile.  I will see this nice  woman again in 6 months.    Sincerely,      Gerrit Friends. Dietrich Pates, MD, [facility]  Electronically Signed    RMR/MedQ  DD: 09/11/2006  DT: 09/11/2006  Job #: [id number]

## 2011-01-14 NOTE — Letter (Signed)
May 17, 2006     Michelle Daniels. Michelle Gambler, MD  [address]  [place], [place] [address]   RE:  Michelle Daniels, Michelle Daniels  MRN:  [id number]  /  DOB:  29-Apr-1946   Dear Michelle Daniels:   Michelle Daniels returns to the office for continued assessment and treatment of  coronary disease and cardiovascular risk factors.  Since her last visit, she  notes a substantial decrease in tobacco use while taking Chantix.  She says  that her breathing is substantially improved.  She reports difficulty in  completely abstaining from tobacco use.   She has Medicaid, and has established a good medical regimen including  simvastatin 40 mg nightly.  Repeat lipid profile is still somewhat  suboptimal, with total cholesterol of 180, HDL of 40, triglycerides of 184  and LDL of 103.   PHYSICAL EXAMINATION:  A pleasant overweight woman in no acute distress.  The weight is 236 - stable, blood pressure 105/75, heart rate 75 and  regular.  NECK:  No jugular venous distention.  LUNGS:  Minor expiratory rhonchi.  CARDIAC:  Normal first and second heart sounds; fourth heart sound present.  ABDOMEN:  Soft and nontender; no masses; no organomegaly.  EXTREMITIES:  Trace edema.   IMPRESSION:  Michelle Daniels is doing well on her current medical regimen, and  is making a good effort to discontinue use of tobacco products.  She is  reminded to receive influenza  vaccine in your office when available.  We will renew her Chantix  prescription.  Her simvastatin will be changed to Vytorin 10/40 mg daily  with her next prescription.  I will see her again in 4 months, at which time  a chemistry profile and lipid profile will be repeated.    Sincerely,      Michelle Friends. Dietrich Pates, MD, [facility]   RMR/MedQ  DD:  05/17/2006  DT:  05/18/2006  Job #:  [id number]

## 2011-01-14 NOTE — Procedures (Signed)
NAME:  Michelle Daniels, LUI NO.:  [id number]   MEDICAL RECORD NO.:  [id number]          PATIENT TYPE:  INP   LOCATION:  A226                          FACILITY:  [facility]   PHYSICIAN:  Pricilla Riffle, MD, FACCDATE OF BIRTH:  07-Jun-1946   DATE OF PROCEDURE:  10/05/2006  DATE OF DISCHARGE:                                ECHOCARDIOGRAM   IDENTIFICATION:  [name] is a 65 year old with known CAD, had a  questionable history of syncope.   TEST:  To evaluate LV function.   A 2-D echo with echo Doppler:  The left ventricle is normal in size with  an end-diastolic dimension of 49-mm, interventricular septum is mildly  thickened at 12-mm.  Posterior wall is mildly thickened at 13-mm.   Right ventricle is normal in size.   Left atrium is mildly dilated at 43-mm.  Right atrium is normal.   The aortic valve is mildly thickened.  There is no significant stenosis.  No insufficiency.  Mitral valve has minimal thickening.  There is mild  annular calcification.  There is trace insufficiency.  Pulmonic valve  not well seen.  Tricuspid valve is normal with no insufficiency.   Overall LV EF is approximately 55% with question some septal  hypokinesis.  Windows are somewhat difficult.  Overall, does not appear  to be significant change from previous.   RV EF is normal.   No pericardial effusion is seen.      Pricilla Riffle, MD, [facility]  Electronically Signed     PVR/MEDQ  D:  10/05/2006  T:  10/05/2006  Job:  [id number]

## 2011-01-14 NOTE — Cardiovascular Report (Signed)
NAME:  Michelle Daniels, Michelle Daniels NO.:  [id number]   MEDICAL RECORD NO.:  [id number]          PATIENT TYPE:  OBV   LOCATION:  6531                         FACILITY:  [facility]   PHYSICIAN:  Michelle Farber, MD  DATE OF BIRTH:  [birth year]   DATE OF PROCEDURE:  11/20/2006  DATE OF DISCHARGE:  11/21/2006                            CARDIAC CATHETERIZATION   PROCEDURE:  1. Left heart catheterization.  2. Left ventriculography.  3. Coronary angiography.  4. Stenting of the left anterior descending.   INDICATIONS:  Michelle Daniels is a 65 year old woman with hypertension,  hypercholesterolemia and coronary artery disease for which she is status  post prior stenting of the RCA.  She presented on March 22 to the [facility] Emergency Room complaining of 2 weeks of chest discomfort and she  was admitted to the hospital and referred for coronary angiography and  possible percutaneous coronary revascularization.   PROCEDURAL TECHNIQUE:  Informed consent was obtained and under 1%  lidocaine local anesthesia, a 5-French sheath was placed in the right  common femoral artery using the modified Seldinger technique.  Left  heart catheterization, left ventriculography and coronary angiography  were performed using pigtail catheters and JL-4 and JR-4 catheters.  Coronary angiography demonstrated approximately 70% calcified stenosis  of the mid LAD and a 70% stenosis of the PDA.  Given her somewhat  atypical symptoms, it was unclear to me whether either of these lesions  were responsible for her discomforts.  To better assess, I had planned  to proceed to pressure wire interrogation, first at the LAD.   To that end, anticoagulation was initiated with heparin and ACT  maintained at greater than 225 seconds.  I attempted to advance a  pressure wire via the 5-French JL-4 diagnostic catheter.  However, I  could not advance it beyond the stenosis.  I then advanced a Prowater  wire to attempt to use  it as a buddy wire, but remained unable to  advance the Prowater wire.   I then upsized to a 6-French JL-4 guide and a 6-French sheath.  I  remained unable to advance the pressure wire across it.  I then rewired  with the Prowater wire.  I attempted to perform intravascular  ultrasound, again with the goal of clarifying the severity of the  stenosis.  However, I was unable to advance the ultrasound probe across  the stenosis.  Repeat angiography demonstrated that in attempting to  advance the ultrasound probe across the stenosis, we had a non-flow-  limiting dissection.  I therefore decided to treat it with stenting,  both to stabilize the dissection and because of the inability to pass  the ultrasound probe made it very likely that the stenosis was quite  severe.   Double-bolus eptifibatide was administered.  ACT was maintained at  greater than 200 seconds.  I predilated the lesion using a 2.0 x 20-mm  Maverick at 10 atmospheres.  I attempted to advance a 2.5 x 30-mm  Endeavor stent across the lesion, but was unable to advance it across  the lesion.  I then tried with  a 2.5 x 12-mm Taxus, but was again unable  to advance it across the lesion.  I exchanged wires via an over-the-wire  balloon for a Wiggle Wire.  I remained unable to advance the Taxus  across the lesion.  I performed additional predilation using a 2.0 x 9-  mm Maverick at 10 atmospheres and then a 2.0 x 12-mm Quantum at 26  atmospheres.  With great difficulty, I was able to advance the 2.5 x 12-  mm Taxus across the lesion.  I was not, however, able to advance it as  far as I would have liked.  As I was unable to advance anything further,  I elected to go ahead and cover the proximal end of the dissection and  the original culprit lesion using the stent.  I therefore deployed it at  16 atmospheres.  With deployment of the stent, there was some pinching  of the diagonal.  I was unable to achieve wire access in this  diagonal  with a Hi-Torque floppy wire.  Flow remained TIMI-3 in the diagonal and  the lesion appeared to be improving with intracoronary nitroglycerin,  thus confirming that at least some of the stenosis was spasm.   The patient was then transferred to the holding room in stable  condition, having tolerated the procedure well.   COMPLICATIONS:  None.   FINDINGS:  1. LV:  140/8/13.  EF 60% with inferior hypokinesis.  2. No aortic stenosis or mitral regurgitation.  3. Left main:  Forty percent ostial stenosis.  4. LAD:  A large vessel giving rise to a very large branching      diagonal.  The proximal LAD is tortuous and heavily calcified with      a 30% stenosis.  The mid LAD has a 70% stenosis just after the      origin of the diagonal; this also was heavily calcified.  It was      stented to no residual.  5. Circumflex:  Moderate-sized vessel giving rise to a single      marginal.  It has only minor luminal irregularities.  6. RCA:  Moderate-sized dominant vessel.  There is a previously placed      stent in the midvessel.  There is a 30% stenosis in the more      downstream midvessel.  There is a 70% stenosis in the PDA.   IMPRESSION/RECOMMENDATIONS:  Successful stenting of appears to be the  culprit lesion in the left anterior descending.  Drug-eluting stent was  used.  She should be maintained on aspirin and Plavix indefinitely.      Michelle Farber, MD  Electronically Signed     WED/MEDQ  D:  11/24/2006  T:  11/25/2006  Job:  [id number]

## 2011-01-14 NOTE — Discharge Summary (Signed)
NAME:  Michelle Daniels, Michelle Daniels NO.:  [id number]   MEDICAL RECORD NO.:  [id number]          PATIENT TYPE:  INP   LOCATION:  A226                          FACILITY:  [facility]   PHYSICIAN:  Madelin Rear. Sherwood Gambler, MD  DATE OF BIRTH:  [birth year]   DATE OF ADMISSION:  10/03/2006  DATE OF DISCHARGE:  02/12/2008LH                               DISCHARGE SUMMARY   DISCHARGE MEDICATIONS:  1. Aspirin 81 mg p.o. daily.  2. Protonix 40 mg p.o. b.i.d.  3. Zocor 40 mg p.o. daily.  4. Zetia 10 mg p.o. daily.  5. Diovan 160 mg/12.5 mg hydrochlorothiazide daily.  6. KCL 20 mEq p.o. daily.  7. Spiriva one inhalation daily.  8. Advair 50/250 one inhalation b.i.d.  9. Cipro 250 mg p.o. b.i.d.  10.Xopenex 1.25 nebulizer q.i.d. p.r.n. wheezing.  11.Darvocet-N 100 p.o. q.i.d. p.r.n. pain.   DISCHARGE DIAGNOSES:  1. Fracture ankle.  2. Chronic obstructive pulmonary disease.   SECOND DIAGNOSES:  1. Syncope, unclear etiology.  2. History of congestive heart failure.  3. Coronary disease.  4. Hyperlipidemia.  5. Hypertension.  6. Gastroesophageal reflux disease.   SUMMARY:  The patient was admitted secondary to fall presumably  precipitated by syncope.  She sustained an acute malleolar fracture and  was seen by orthopedics with care rendered there.  She also had some  possible  hematuria which was evaluated by Urology.  Consultations did include  Neurology, Cardiology and Orthopedics, as outlined above.  She responded  well to in-house care.  Subsequent skilled nursing facility placement  was obtained and on the day of discharge she was hemodynamically and  clinically stable.      Madelin Rear. Sherwood Gambler, MD  Electronically Signed     LJF/MEDQ  D:  10/10/2006  T:  10/10/2006  Job:  [id number]

## 2011-01-14 NOTE — H&P (Signed)
NAME:  Michelle Daniels, ISIP NO.:  [id number]   MEDICAL RECORD NO.:  [id number]          PATIENT TYPE:  OBV   LOCATION:  2005                         FACILITY:  [facility]   PHYSICIAN:  Dorian Pod, ACNP  DATE OF BIRTH:  26-Oct-1945   DATE OF ADMISSION:  11/18/2006  DATE OF DISCHARGE:                              HISTORY & PHYSICAL   PRIMARY CARDIOLOGIST:  Gerrit Friends. Dietrich Pates, MD, [facility].   PRIMARY CARE PHYSICIAN:  Madelin Rear. Sherwood Gambler, M.D.   [name] is a 65 year old Caucasian female who presents to [facility] via [facility] complaining of chest  discomfort x2 weeks.  Apparently, her home health nurse was visiting  today.  The patient expressed concern over the intermittent chest  discomfort she was having.  Apparently, the home health nurse called Dr.  Phillips Odor who advised the patient be transported to [facility]  for evaluation.  [name] is a very poor historian.  She states this  is the same pain she has had on and off for several months, just  recently was admitted, at which time we consulted and the patient had a  stress Myoview that was in February of this year.  She was found to have  an EF of 40%, no ischemia, and global hypokinesis.  Apparently, Ms.  Erck had fallen and fractured her right foot at that time was also  question of syncope and Dr. Tenny Craw saw the patient in consultation.  There  was never found to be any clear etiology of syncope, however, it was  classified as a fall presumable precipitated by syncope.  The patient  was discharged with a cast to her right foot.  She has been in a  wheelchair since that time.  Just within the last 2 weeks, her  wheelchair was switched out for a new one.  It was during this time the  patient noticed the chest discomfort.  She describes it as a substernal  chest pain.  She then states it is worse at night when she moves or  tries to turn over.  It is also described as  soreness when she moves her  arms or rotates her torso.  She also states her blood pressure has been  mildly elevated at home, but apparently her Diovan dose was cut in half  during a recent hospitalization with questionable syncopal episode.  Currently, she is pain free.   ALLERGIES:  NO KNOWN DRUG ALLERGIES.   MEDICATIONS:  1. Advair inhaler.  2. Crestor.  3. Diovan 160/12.5.  4. KCl 20.  5. Welchol 625 t.i.d.  6. Prilosec.   PAST MEDICAL HISTORY:  1. COPD.  2. Tobacco use.  The patient just recently quit smoking because she      cannot drive to get to the store to get her cigarettes and her      daughter refuses to buy them for her.  3. Coronary artery disease, status post MI in 1999, status post PCI at      that time to the RCA reducing a 99% RCA lesion with TIMI  1 flow to      0%, and residual LAD disease 40%.  4. Hypertension.  5. Dyslipidemia.  6. Questionable history of CHF, per primary care documentation.  7. Syncope with unknown etiology.  No history of ventricular      tachycardia on telemetry  8. GERD.   SOCIAL HISTORY:  She lives in [place] with her husband who is also  ill.  She is disabled secondary to her COPD.  She has three adult  children.  Just quit smoking 2 months ago.  No exercise, has been in a  wheelchair for the last month and half, tries to follow a heart-healthy  diet.   FAMILY HISTORY:  No known coronary disease.  Siblings deceased secondary  to unknown cause.  Also, positive family history of diabetes and cancer.   REVIEW OF SYSTEMS:  Positive for chest pain as described above, chronic  dyspnea on exertion, chronic cough, wheezing, dizziness at times, pain  in right foot, left hip discomfort, and intermittent nausea.   PHYSICAL EXAMINATION:  VITAL SIGNS:  Temperature 97.8, pulse 67,  respirations 18, blood pressure 158/71.  The patient is sating 99% on 2  liters.  GENERAL:  She is generally in no acute distress, appears much older than   stated age.  HEENT:  Normocephalic and atraumatic.  Pupils equal, round, react to  light.  Sclerae is clear.  NECK:  Supple without lymphadenopathy.  No bruits.  No JVD.  CARDIOVASCULAR:  S1-S2 regular rate and rhythm.  LUNGS:  Clear to auscultation bilaterally.  CHEST:  Sore to movement and palpation.  SKIN:  Warm and dry.  ABDOMEN:  Soft, nontender, positive bowel sounds.  LOWER EXTREMITIES:  Without clubbing, cyanosis.  The patient does have  swelling to her right foot.  It is tender to touch, status post recent  fracture/cast removal.  MUSCULOSKELETAL:  The patient having pain in her shoulders with  movement, limited range of motion.  NEUROLOGIC:  She is alert and oriented x3.  Cranial II-XII grossly  intact.   Chest x-ray showing cardiomegaly, no CHF.  EKG sinus rhythm at 68 with  no acute ST-T wave changes.  H&H 15.4, WBCs 9.9 platelets 323,000.  Sodium 138, potassium 3.6, BUN 13, creatinine 0.6, glucose 98.  Point-of-  care markers negative x2.  D-dimer 0.46.   Dr. Nicholes Mango in to examine and assess [name], who presents  with atypical chest discomfort, chest pain reproducible to movement and  palpation, however, in the setting of mild hypertension, and history of  syncope, with recent adjustments in blood pressure medication.   The patient will be admitted overnight for observation, rule out with  cardiac enzymes, monitor blood pressure, and dizziness.  She denies any  recent episodes of syncope, however, she has been wheelchair-bound with  limited activity secondary to recent foot fracture.  We will plan on  discharging in the a.m. if stable and rules out.   Followup with Dr. Dietrich Pates in [place].      Dorian Pod, ACNP     MB/MEDQ  D:  11/18/2006  T:  11/18/2006  Job:  [id number]

## 2011-01-14 NOTE — Consult Note (Signed)
NAME:  KEANI, GOTCHER NO.:  [id number]   MEDICAL RECORD NO.:  [id number]          PATIENT TYPE:  INP   LOCATION:  A226                          FACILITY:  [facility]   PHYSICIAN:  Pricilla Riffle, MD, FACCDATE OF BIRTH:  08/14/1946   DATE OF CONSULTATION:  10/05/2006  DATE OF DISCHARGE:                                 CONSULTATION   IDENTIFICATION:  Ms. Ocasio is a 65 year old woman who we are asked to  see regarding question syncope, question VT.   HISTORY OF PRESENT ILLNESS:  The patient is a 65 year old woman with  known CAD.  She is status post inferior wall MI in 1999, status post  PCI.  She was admitted on October 03, 2006, after a fall.  She was  walking to the mailbox.  She said she turned, came back, woke up on the  ground, crawled to the house because her foot hurt, does not remember  fall, denies dizziness, no chest pain, takes activity slowly, does note  passing out before but cannot remember when, notes dizziness at times,  occasional chest pain at times, overall poor historian.  Daughter notes  the patient complained of shortness of breath 2 days prior to this  admission, took two nitroglycerin and shortness of breath eases, also  notes the patient has been increasingly unsteady, has had episodes of  syncope, last one was in church about 1 month ago when she was singing  in the choir.   ALLERGIES:  NONE.   MEDICATIONS:  Here in the hospital include:  1. Lovenox 60 subcu daily.  2. Dilaudid p.r.n.  3. Tylox p.r.n.  4. Xopenex p.r.n.  5. Advair 50/250 b.i.d.  6. Spiriva every day.  7. Potassium 20 mEq daily.  8. Diovan/HCTZ 320/12.5 every day.  9. Vytorin 10/40 q.h.s.  10.Omeprazole 20 daily.  11.Aspirin 81 mg daily   PAST MEDICAL HISTORY:  1. CAD.  As noted above, 40% LAD lesion on cath in 1999, and 99% RCA      lesion with TIMI I flow, underwent PCI to 0%.  2. Dyslipidemia.  3. Hypertension.  4. COPD.  Continues to smoke, though she says  she quit recently.  5. GE reflux.   SOCIAL HISTORY:  The patient again has a history of tobacco, on and off  smoking, no alcohol.  Lives at home.   FAMILY HISTORY:  Noncontributory to above problem.   REVIEW OF SYSTEMS:  All systems reviewed, negative to the above problem  except as noted above.   PHYSICAL EXAMINATION:  GENERAL:  The patient is in no acute distress.  VITAL SIGNS:  Temperature is 99.5, blood pressure systolic is 99-129,  pulse is in the 80s.  I's and O's positive 650, the last 24 hours.  HEENT:  Normocephalic, atraumatic.  EOMI.  PERRL.  Mouth clear.  NECK:  JVP is normal.  No bruits.  No thyromegaly.  LUNGS:  Rhonchi bilaterally, mild wheezes greater on the left.  CARDIAC:  Regular rate rhythm.  S1-S2.  No S3-S4.  No significant  murmurs.  ABDOMEN:  Supple.  No hepatomegaly.  Nontender.  No masses.  EXTREMITIES:  Right foot is swollen.  It is wrapped (fractured).  The  left foot with 2+ dorsalis pedis, no edema.  NEUROLOGIC:  Motor exam moves all extremities.  Otherwise deferred.   LABORATORY:  Shows a hemoglobin of 13.5, WBC of 11.3, BUN and creatinine  of 11 and 0.8, potassium 5.1. CK 67, MB 1/42, MB 0.7, troponin 0.03;  0.04.  TSH 1.9.  UA:  Specific gravity greater than 1.030 on admission,  pH of 5.5.  EKG in October 03, 2006, shows sinus tachycardia rate of 101  beats per minute, anterior wall MI.  Telemetry:  Wide complex probably  artifact, not convinced VT.   IMPRESSION:  The patient is a 65 year old with a history of coronary  artery disease, tobacco use, hypertension, chronic obstructive pulmonary  disease, now with fall, question syncope and now fibular fracture.  She  is overall a poor historian.  She does give a history of dizziness at  times, has a history of syncope.  Again, daughter says she is short of  breath at times, thinks this is worse, also notes increased unsteadiness  and syncope x4. Exam significant for wheezes, fracture of ankle.  EKG   with inferior myocardial infarction.  No definite ventricular  tachycardia on telemetry, probably artifact.  Note urine was  concentrated on admission, question if she is orthostatic, though no  history of dizziness at time of fall.   History also suspicious for syncope without prodrome, would:  1. Schedule echo to see if no change in EF.  2. Carotid ultrasound.  3. Check orthostatics lying, sitting, standing if possible, record in      chart.  4. Dobutamine Myoview unless EF is markedly different from 2003, and      in that case would recommend cardiac catheterization.  5. With history of syncope, unsteadiness, check at least head CT      without contrast.  6. Check BNP.   We will continue to follow with you.      Pricilla Riffle, MD, [facility]  Electronically Signed     PVR/MEDQ  D:  10/05/2006  T:  10/05/2006  Job:  [id number]

## 2011-01-14 NOTE — H&P (Signed)
NAME:  Michelle Daniels, STOGNER NO.:  [id number]   MEDICAL RECORD NO.:  [id number]          PATIENT TYPE:  INP   LOCATION:  A226                          FACILITY:  [facility]   PHYSICIAN:  Madelin Rear. Sherwood Gambler, MD  DATE OF BIRTH:  [birth year]   DATE OF ADMISSION:  10/03/2006  DATE OF DISCHARGE:  LH                              HISTORY & PHYSICAL   CHIEF COMPLAINT:  Leg pain, ankle pain.   HISTORY OF PRESENT ILLNESS:  The patient presented to the emergency  department and was evaluated by Dr. Bradd Canary for a fall. History  apparently came from a family member because the patient does not recall  how it happened. There was a possible syncopal episode involved during  the fall, injured the right ankle, hip and shoulder. She apparently was  walking to her mailbox when this happened. She denied any chest pain to  me as well as the emergency physician or palpitations or any prodromal  symptoms. Location of pain is in her right ankle and right shoulder.  Quality is sharp. Context is it is increased with movement or pressure,  especially in the right ankle. Associated symptoms, as above, possible  syncope. Duration:  This is an acute event, with an hour prior to  emergency room visitation. At present, she has no complaints except  ankle pain.   PAST MEDICAL HISTORY:  Positive for.  1. Congestive heart failure.  2. COPD.  3. Hypertension.  4. Gastroesophageal reflux disease.  5. Hyperlipidemia.  6. Coronary artery disease.   SOCIAL HISTORY:  She continues to smoke cigarettes in spite of several  episodes of counseling in the office and attempts at quitting. She uses  no alcohol or other illicit drugs.   FAMILY HISTORY:  Noncontributory for this illness.   REVIEW OF SYSTEMS:  In detail is negative. Please see history of present  illness for pertinent positives and negatives. Specifically,  constitutional, she had no symptoms. Neurologically, she had no seizure  activity noted  or headache. There has been no change in level of  consciousness other than questionable syncope which is not certain at  this point. Hematologic/lymphatic system clear for symptoms. She denies  any fevers, rigors or chills.   She is noted to have no allergies to any medications at this time.   PHYSICAL EXAMINATION:  She is awake, alert, cooperative.  Her head and neck showed no JVD or adenopathy.  NECK:  Is supple.  CHEST:  Diffuse inspiratory and expiratory wheezing which is her  baseline, possibly a little bit worse than usual.  CARDIAC EXAM:  Distant, regular rhythm, no gallop or rub.  ABDOMEN:  Soft, no organomegaly or masses.  EXTREMITIES:  Positive splinted right lower extremity at the ankle with  tenderness and soft tissue swelling.  NEUROLOGICAL:  Nonfocal.   LABORATORY STUDIES:  Right ankle x-ray reveals a distal right fibular  fracture with slight displacement and mild lateral talotibial  subluxation. Right hip x-ray was obtained with no fracture evident. She  had a right shoulder x-ray obtained as well which shows no acute  abnormality, and  degenerative changes were noted by the radiologist. CBC  revealed 14.8 g% hemoglobin, 12.0 white count as well as left shift and  normal platelet count. Electrolytes were normal, slight hyperglycemia at  118, not significant. Normal renal function. Liver function tests were  negative, and cardiac enzymes were negative. A 12-lead ECG was not  obtained apparently in the emergency department, or at least I could not  locate it on review of records.   IMPRESSION:  1. Possible syncope. Admit for telemetry monitoring. Possible      cardiology consultation if indicated.  2. Acute malleolar fracture. She will need orthopedic attention. Will      do this in house. Adequate analgesia and splinting until      orthopedist sees the patient.  3. Chronic obstructive pulmonary disease with possibly acute-on-      chronic exacerbation. Will deliver  some bronchodilators and check a      chest x-ray.      Madelin Rear. Sherwood Gambler, MD  Electronically Signed     LJF/MEDQ  D:  10/04/2006  T:  10/04/2006  Job:  [id number]

## 2011-01-14 NOTE — Consult Note (Signed)
NAME:  Michelle Daniels, Michelle Daniels NO.:  [id number]   MEDICAL RECORD NO.:  [id number]          PATIENT TYPE:  INP   LOCATION:  A226                          FACILITY:  [facility]   PHYSICIAN:  Ky Barban, M.D.DATE OF BIRTH:  November 23, 1945   DATE OF CONSULTATION:  10/08/2006  DATE OF DISCHARGE:                                 CONSULTATION   CHIEF COMPLAINT:  Questionable gross hematuria.   HISTORY:  [age]-year-old female who is primarily admitted per Dr.  Sherwood Gambler because of leg pains and ankle pains. She has a history of  congestive heart failure, COPD, hypertension that led to the possibility  of a syncopal episode this time. She sustained an acute malleolar  fracture. I was called in initially because on admission she had a Foley  catheter that was removed then noted gross hematuria but the patient is  unaware of that. She said she has no urological complaints and she is  voiding fine since the catheter came out.   PAST MEDICAL HISTORY:  1. Positive for severe chronic obstructive pulmonary disease.  2. Hypertension.  3. Gastroesophageal reflux disease.  4. Hyperlipidemia.  5. Coronary artery disease.   PHYSICAL EXAMINATION:  GENERAL: Moderately obese. Not in acute distress.  Fully conscious, alert and oriented.  VITAL SIGNS: Blood pressure is 140/80, temperature is normal.  ABDOMEN: Obese.  PELVIC EXAM: Unremarkable (not done by me).   LABORATORY DATA:  Her WBC count is 11.3 thousand, hematocrit is 40.  Sodium 143, potassium 5.1, chloride 109, CO2 is 30. Urinalysis shows 21-  50 rbc's on a catheterized specimen. Urine culture shows no growth.   IMPRESSION:  Possible history of gross hematuria.   RECOMMENDATIONS:  Check urinalysis, if there is any problem then I will  make further recommendation.      Ky Barban, M.D.  Electronically Signed     MIJ/MEDQ  D:  10/08/2006  T:  10/08/2006  Job:  [id number]

## 2011-01-14 NOTE — Consult Note (Signed)
NAME:  Michelle Daniels, CARP               ACCOUNT NO.:  [id number]   MEDICAL RECORD NO.:  [id number]          PATIENT TYPE:  INP   LOCATION:  A226                          FACILITY:  [facility]   PHYSICIAN:  Kofi A. Gerilyn Pilgrim, M.D. DATE OF BIRTH:  14-Dec-1945   DATE OF CONSULTATION:  10/06/2006  DATE OF DISCHARGE:                                 CONSULTATION   REASON FOR CONSULTATION:  The patient is 65 year old white female with  multiple medical problems.  She presented to the hospital after falling.  It turns out that she has had several falls over the last few months.  The patient simply states that she becomes lightheaded and dizzy on  standing.  Additionally, she has some right knee pain.  The patient was  simply walking on the day of admission and turned around after getting  her mail from the mailbox, she subsequently fell, no loss of  consciousness is reported.   PAST MEDICAL HISTORY:  1. Congestive heart failure.  2. COPD.  3. Hypotension.  4. Gastroesophageal reflux disease.  5. Hyperlipidemia.  6. Coronary artery disease.   REVIEW OF SYSTEMS:  No headaches or seizures are reported.  Again, no  loss of consciousness for the patient.  No fevers.   CURRENT MEDICATIONS:  Aspirin, Cipro, Lovenox, Zetia, Advair,  hydrochlorothiazide, Protonix, Diovan, Benadryl p.r.n., Imodium p.r.n.,  Darvocet p.r.n., Zofran p.r.n., and Ambien p.r.n.   SOCIAL HISTORY:  The patient lives at home.  She uses tobacco on and  off.  No alcohol or drug use.   PHYSICAL EXAMINATION:  GENERAL:  A mildly overweight lady in no acute  distress.  VITAL SIGNS:  Temperature 98.4, pulse 76, respirations 20, blood  pressure 146/75.  HEENT EVALUATION:  Neck is supple.  Neck is normocephalic, atraumatic.  ABDOMEN:  Somewhat soft but obese.  EXTREMITIES:  Right leg is in a cast.  NEUROLOGICAL:  Mentation:  The patient is awake and alert.  She is  oriented to person, place and time.  She does however seem to have  marked limitation in insight as to her medical condition why she is in  the hospital.  She seemed to have a mild dysarthria but states that this  is her baseline.  No language problems are reported.  She follows  commands well, although she sometimes resists the neurological  examination.  Cranial nerve evaluation:  Pupils are 4 mm and reactive.  They are round.  Visual fields are intact.  Extraocular movements are  full.  Facial muscle strength is symmetric.  Tongue is midline.  Uvula  is midline.  Shoulder shrugs are normal.  Motor examination involving  the upper extremities and left leg shows normal tone, bulk and strength.  Right leg is obviously weak from the fracture.  Reflexes are preserved  in the upper extremities but markedly diminished in the legs  bilaterally.  Sensation- pinprick,  temperature and light touch,  coordination is normal.   STUDIES:  CT scan of the brain shows nothing acute.  There is mild  chronic small vessel ischemic changes.   LABORATORY EVALUATION:  WBC 12,  hemoglobin 14.8, platelets 319, sodium  143, potassium 5.1, chloride 109, CO2 30, glucose 113, BUN 11,  creatinine 0.7, calcium 9.5.  Liver enzymes are normal.  CPK 42 and 67.  TSH 1.8.  Urinalysis:  Large blood, negative nitrites, negative  leukocytes.   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  The  neuropathy could be the etiology of her dizziness with possible  orthostatic hypotension.  Additionally, dizziness could be a medication  effect.   RECOMMENDATIONS:  1. Orthostatics when appropriate.  2. Blood testing for causative neuropathy.  3. Consider electrodiagnostic testing/EMG.  4. Minimize medication as much as possible.   Thanks for this consultation.      Kofi A. Gerilyn Pilgrim, M.D.  Electronically Signed     KAD/MEDQ  D:  10/06/2006  T:  10/06/2006  Job:  [id number]

## 2011-01-14 NOTE — Discharge Summary (Signed)
   NAME:  Michelle Daniels, Michelle Daniels NO.:  [id number]   MEDICAL RECORD NO.:  [id number]                    PATIENT TYPE:   LOCATION:                                       FACILITY:   PHYSICIAN:  Madelin Rear. Sherwood Gambler, M.D.             DATE OF BIRTH:   DATE OF ADMISSION:  12/17/2002  DATE OF DISCHARGE:  12/21/2002                                 DISCHARGE SUMMARY   DISCHARGE DIAGNOSIS:  Acute exacerbation of chronic obstructive pulmonary  disease with bronchospasm and __________ bronchitis.   DISCHARGE MEDICATIONS:  1. Albuterol nebulizer q.i.d.  2. Levaquin 500 mg p.o. daily.  3. Prednisone on a tapered dose pack, scheduled.   SUMMARY:  The patient admitted with shortness of breath severe wheezing and  rhonchi.  She received intense bronchodilator therapy and IV steroids as  well as antibiotics and improved gradually until the day of discharge she  was virtually clear.   DISCHARGE INSTRUCTIONS:  Follow up in office.                                               Madelin Rear. Sherwood Gambler, M.D.    LJF/MEDQ  D:  01/05/2003  T:  01/06/2003  Job:  [id number]

## 2011-01-14 NOTE — Consult Note (Signed)
NAME:  Michelle Daniels, Michelle Daniels               ACCOUNT NO.:  [id number]   MEDICAL RECORD NO.:  [id number]          PATIENT TYPE:  INP   LOCATION:  A226                          FACILITY:  [facility]   PHYSICIAN:  J. Darreld Mclean, M.D. DATE OF BIRTH:  January 29, 1946   DATE OF CONSULTATION:  DATE OF DISCHARGE:                                 CONSULTATION   REQUESTING PHYSICIAN:  Dr. Sherwood Gambler.   CHIEF COMPLAINT:  I hurt my ankle.   HISTORY OF PRESENT ILLNESS:  The patient is a 65 year old female who was  walking to her mailbox, passed out and hurt her right ankle.  She has  got a nondisplaced fracture of the right lateral malleolus.  She injured  her right shoulder, but has got range of motion and there was no  fracture by x-ray.  Neurovascular status is intact.  She is in pain.  She does have a Foley in place.   IMPRESSION:  1. Nondisplaced fracture of right lateral malleolus, in posterior      splint.  2. Right shoulder pain.   PLAN:  Begin physical therapy, occupational therapy, use of a walker,  touch-down only on the right.  I told her she would be in a cast for  approximately 4-6 weeks.  Keep the posterior splint on the next several  days and I can change this to a regular cast in the office after  discharge next week; I can see her and do this.  Meanwhile, continue  analgesia, ice and elevation.  I have ordered an overhead frame.   Thank you.  Will follow.           ______________________________  Shela Commons. Darreld Mclean, M.D.     JWK/MEDQ  D:  10/04/2006  T:  10/04/2006  Job:  [id number]
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
## 2011-01-14 NOTE — Discharge Summary (Signed)
NAME:  Michelle Daniels, Michelle Daniels NO.:  [id number]   MEDICAL RECORD NO.:  [id number]          PATIENT TYPE:  OBV   LOCATION:  6531                         FACILITY:  [facility]   PHYSICIAN:  Veverly Fells. Excell Seltzer, MD  DATE OF BIRTH:  [birth year]   DATE OF ADMISSION:  11/18/2006  DATE OF DISCHARGE:                         DISCHARGE SUMMARY - REFERRING   DISCHARGE DIAGNOSES:  1. Atypical chest discomfort.  2. Coronary artery disease status post drug-eluting stent to the left      anterior descending artery as described.  3. Chronic obstructive pulmonary disease with continued tobacco use.  4. Hypertension.  5. Hyperlipidemia.  6. Hypokalemia.  7. Debilitation secondary to right foot fracture.   PROCEDURES PERFORMED:  Cardiac catheterization with drug-eluting  stenting by Dr. Samule Ohm on November 20, 2006.   SUMMARY OF HISTORY:  Michelle Daniels is a 65 year old female who presented  with substernal chest discomfort.  She described this as worse at night,  especially when she moves or turns over.  Her home health nurse has been  seeing her secondary to wheelchair-bound secondary to a right foot  fracture and cast.  After evaluation, the nurse call Dr. Phillips Odor in  regards to the patient's chest discomfort and was referred to the  emergency room.  At the emergency room she was pain free.  It was noted  that the patient was a poor historian.  Evaluation in the recent past  shows a negative stress Myoview performed in October 06, 2006, showed EF  of 40% without ischemia.  An echocardiogram showed global hypokinesis  with EF of 40%.   Her past medical history is notable for COPD with continued tobacco use,  coronary artery disease with myocardial infarction in 1999 and  intervention to the RCA at that time.  She also has a history of  hypertension, dyslipidemia, GERD, and syncope.   LABORATORY DATA:  Chest x-ray on March 22 showed cardiac enlargement but  without evidence of heart failure.   Admission weight was 102.6 kg.  Admission H&H was 13.9 and 40.7, normal indices, platelets 323, wbc's  9.9.  Prior to discharge H&H was 13.6 and 38.4, normal indices,  platelets 311, wbc's 8.4.  PTT was 52, D-dimer was normal at 0.46.  Sodium 138, potassium 3.5, BUN 14, creatinine 0.65 with a GFR greater  than 16, normal LFTs.  Prior to discharge BUN/creatinine was 12 and 0.7,  potassium 3.7, amylase 20, lipase 21.  CK-MBs, relative indexes and  troponins were within normal limits x3.  Fasting lipids showed a total  cholesterol 183, triglycerides 211, HDL 36, LDL 105.  EKGs during her  admission showed normal sinus rhythm, normal axis, decreased voltage,  probable old inferior myocardial infarction, no acute changes.   HOSPITAL COURSE:  Michelle Daniels was admitted to [facility] and  placed on subcu Lovenox in addition to her home medications.  Dr.  Gala Romney felt her chest discomfort was somewhat atypical but with her  recent negative Myoview he felt that cardiac catheterization should be  performed to exclude coronary artery disease.  The patient was on  Crestor prior  to admission.  She placed on Zocor and this was increased  to 80 mg.  Cardiac catheterization was performed and Dr. Samule Ohm  performed Taxus stenting to an 80% LAD lesion, reducing this to 0%  without difficulty.  Post catheterization, catheterization site was  intact.  Cardiac rehab assisted with education and ambulation.  However,  given her right foot fracture and cast she was not a candidate for  cardiac rehab at this time.  Dr. Excell Seltzer reassessed the patient on November 21, 2006, and felt that the patient could be discharged home.  Dr.  Excell Seltzer noted on discharge that the patient is illiterate and that her  daughter assists with medications.   DISPOSITION:  Michelle Daniels is discharged home.  She is asked to maintain  a low-salt, heart-healthy diet.  Her wound care and activities are per  supplemental discharge  sheet.   New medications include:  1. Aspirin 325 mg daily.  2. Plavix 75 mg daily.  3. Nitroglycerin 0.4 as needed.  4. Zocor 80 mg daily.   She was advised to continue:  1. Diovan HCT 165/12.5 daily.  2. K-Dur 20 mEq daily.  3. Advair 250/50 one puff b.i.d.  4. Welchol 625 t.i.d.  5. Prilosec as previously.   She was advised not to take her Crestor.   She was also advised no smoking or tobacco products.  She will need  blood work in 6-8 weeks FLP and LFTs since Zocor was initiated.  She was  asked to bring all medications to all appointments.  She will follow up  with Dr. Excell Seltzer in the [facility] office on December 13, 2006, at 11:30  a.m., Dr. Sherwood Gambler as needed.   Discharge time 40 minutes.      Joellyn Rued, PA-C      Veverly Fells. Excell Seltzer, MD  Electronically Signed    EW/MEDQ  D:  11/21/2006  T:  11/21/2006  Job:  [id number]   cc:   Veverly Fells. Excell Seltzer, MD  Madelin Rear. Sherwood Gambler, MD  Corrie Mckusick, M.D.

## 2011-01-21 ENCOUNTER — Encounter: Payer: Self-pay | Admitting: Cardiovascular Disease

## 2011-01-21 NOTE — Assessment & Plan Note (Signed)
Hydrochlorothiazide was added at the time of her last office visit for suboptimal blood pressure control. The pressure is improved today on combination of losartan, hydrochlorothiazide, and metoprolol. We'll continue current medications.

## 2011-01-21 NOTE — Assessment & Plan Note (Signed)
Lipids were checked in September 2011 demonstrating an HDL greater than 40 and LDL less than 100. Lifestyle modification was reviewed with the patient. Note her triglycerides were elevated greater than 250.

## 2011-01-21 NOTE — Assessment & Plan Note (Signed)
The patient is stable without angina. She is on a good medical regimen which includes a beta blocker, ARB, statin, and aspirin. We discussed the importance of tobacco cessation. I think her dyspnea has related to lung disease and she continues to be a heavy smoker.

## 2011-01-21 NOTE — Progress Notes (Signed)
HPI:  65 year-old woman with CAD s/p past PCI procedures and CABG for treatment of severe left main disease. She presented with Class 3 angina and ultimately underwent CABG by Dr Donata Clay in April 2011.   The patient continues to complain of exertional dyspnea with low-level activity. She has fleeting chest pains but no chest pain or pressure with exertion. She continues to smoke cigarettes. She denies leg edema, palpitations, or syncope.  Outpatient Encounter Prescriptions as of 01/12/2011  Medication Sig Dispense Refill  . aspirin 81 MG tablet Take 81 mg by mouth daily.        . colesevelam (WELCHOL) 625 MG tablet Take 3 tablets twice a day  180 tablet  11  . hydrochlorothiazide 25 MG tablet TAKE 1 TABLET BY MOUTH   ONCE A DAY.  30 tablet  9  . losartan (COZAAR) 100 MG tablet Take 100 mg by mouth daily.        . metFORMIN (GLUCOPHAGE) 500 MG tablet Take 500 mg by mouth daily.        . metoprolol tartrate (LOPRESSOR) 25 MG tablet Take 25 mg by mouth 2 (two) times daily.        . nitroGLYCERIN (NITROSTAT) 0.4 MG SL tablet Place 0.4 mg under the tongue every 5 (five) minutes as needed.        Marland Kitchen omeprazole (PRILOSEC) 20 MG capsule Take 20 mg by mouth daily.        . rosuvastatin (CRESTOR) 20 MG tablet Take 20 mg by mouth daily.        Marland Kitchen DISCONTD: colesevelam (WELCHOL) 625 MG tablet Take 3 tablets twice a day       . azithromycin (ZITHROMAX) 250 MG tablet Take 2 tablets by mouth on day 1, followed by 1 tablet by mouth daily for 4 days.  6 each  0    No Known Allergies  Past Medical History  Diagnosis Date  . Coronary artery disease     s/p MI and prior PCI (bare metal stent RCA and DES in LAD)  . COPD (chronic obstructive pulmonary disease)   . Hypertension   . Dyslipidemia   . GERD (gastroesophageal reflux disease)     ROS: Negative except as per HPI  BP 131/81  Pulse 92  Resp 20  Ht 5\' 8"  (1.727 m)  Wt 238 lb (107.956 kg)  BMI 36.19 kg/m2  PHYSICAL EXAM: Pt is alert and  oriented, obese woman in NAD HEENT: normal Neck: JVP - normal, carotids 2+= without bruits Lungs: CTA bilaterally CV: RRR without murmur or gallop Abd: soft, NT, Positive BS, no hepatomegaly Ext: no C/C/E, distal pulses intact and equal Skin: warm/dry no rash  EKG:  Normal sinus rhythm 92 beats per minute, nonspecific T wave abnormality, cannot rule out age-indeterminate anterior infarct.  ASSESSMENT AND PLAN:

## 2011-05-25 ENCOUNTER — Other Ambulatory Visit: Payer: Self-pay | Admitting: Cardiovascular Disease

## 2011-06-03 LAB — CBC
HCT: 43
Platelets: 227
RDW: 14.1
WBC: 6

## 2011-06-03 LAB — DIFFERENTIAL
Basophils Absolute: 0
Lymphocytes Relative: 31
Lymphs Abs: 1.8
Neutro Abs: 2.9
Neutrophils Relative %: 49

## 2011-06-03 LAB — B-NATRIURETIC PEPTIDE (CONVERTED LAB): Pro B Natriuretic peptide (BNP): <30

## 2011-06-03 LAB — BLOOD GAS, ARTERIAL
FIO2: 0.21
Patient temperature: 37
TCO2: 21.4
pCO2 arterial: 37.4
pH, Arterial: 7.433 — ABNORMAL HIGH

## 2011-06-03 LAB — D-DIMER, QUANTITATIVE: D-Dimer, Quant: 0.32

## 2011-07-01 ENCOUNTER — Ambulatory Visit (INDEPENDENT_AMBULATORY_CARE_PROVIDER_SITE_OTHER): Payer: Medicaid Other | Admitting: Cardiovascular Disease

## 2011-07-01 ENCOUNTER — Encounter: Payer: Self-pay | Admitting: Cardiovascular Disease

## 2011-07-01 DIAGNOSIS — I25810 Atherosclerosis of coronary artery bypass graft(s) without angina pectoris: Secondary | ICD-10-CM

## 2011-07-01 DIAGNOSIS — I10 Essential (primary) hypertension: Secondary | ICD-10-CM

## 2011-07-01 DIAGNOSIS — E785 Hyperlipidemia, unspecified: Secondary | ICD-10-CM

## 2011-07-01 NOTE — Progress Notes (Signed)
HPI:  This is a 65 year old woman presenting for followup evaluation. She has a history of coronary artery disease, diabetes, COPD, and chronic tobacco use. She has undergone multiple PCI procedures and ultimately required coronary bypass surgery in 2011. She presents today for followup.  The patient continues to have chest pain on an intermittent basis. This is unrelated to exertion or emotional stress. She has a "light pain" in a localized area in the left chest. The patient has chronic dyspnea and continues to smoke cigarettes. There's been slow progression of her shortness of breath. She complains of mild lower extremity swelling. She denies orthopnea or PND.  Outpatient Encounter Prescriptions as of 07/01/2011  Medication Sig Dispense Refill  . aspirin 81 MG tablet Take 81 mg by mouth daily.        . colesevelam (WELCHOL) 625 MG tablet Take 3 tablets twice a day  180 tablet  11  . COZAAR 100 MG tablet TAKE ONE TABLET BY MOUTH ONCE DAILY.  30 each  6  . cycloSPORINE (RESTASIS) 0.05 % ophthalmic emulsion Place 1 drop into both eyes 2 (two) times daily.        . fish oil-omega-3 fatty acids 1000 MG capsule Take 2 g by mouth daily.        . hydrochlorothiazide 25 MG tablet TAKE 1 TABLET BY MOUTH   ONCE A DAY.  30 tablet  9  . levothyroxine (SYNTHROID, LEVOTHROID) 25 MCG tablet Take 25 mcg by mouth daily.        . metFORMIN (GLUCOPHAGE) 500 MG tablet Take 500 mg by mouth daily.        . metoprolol tartrate (LOPRESSOR) 25 MG tablet Take 25 mg by mouth 2 (two) times daily.        . nitroGLYCERIN (NITROSTAT) 0.4 MG SL tablet Place 0.4 mg under the tongue every 5 (five) minutes as needed.        Marland Kitchen omeprazole (PRILOSEC) 20 MG capsule Take 20 mg by mouth daily.        . rosuvastatin (CRESTOR) 20 MG tablet Take 20 mg by mouth daily.          No Known Allergies  Past Medical History  Diagnosis Date  . Coronary artery disease     s/p MI and prior PCI (bare metal stent RCA and DES in LAD)  . COPD  (chronic obstructive pulmonary disease)   . Hypertension   . Dyslipidemia   . GERD (gastroesophageal reflux disease)     ROS: Negative except as per HPI  BP 126/72  Pulse 99  Ht 5\' 6"  (1.676 m)  Wt 238 lb (107.956 kg)  BMI 38.41 kg/m2  PHYSICAL EXAM: Pt is alert and oriented, very pleasant obese woman in NAD HEENT: normal Neck: JVP - normal, carotids 2+= without bruits Lungs: bilateral expiratory wheezing noted CV: RRR without murmur or gallop Abd: soft, NT, Positive BS, no hepatomegaly Ext: trace pretibial edema bilaterally Skin: warm/dry no rash  EKG:  Normal sinus rhythm 99 beats per minute, anterior infarct age undetermined, inferior infarct age undetermined, no significant ST or T wave changes  ASSESSMENT AND PLAN:

## 2011-07-01 NOTE — Patient Instructions (Signed)
Your physician wants you to follow-up in: 6 months  You will receive a reminder letter in the mail two months in advance. If you don't receive a letter, please call our office to schedule the follow-up appointment.  Your physician recommends that you continue on your current medications as directed. Please refer to the Current Medication list given to you today.  

## 2011-07-01 NOTE — Assessment & Plan Note (Signed)
Lipids are followed by her primary care physician. She is on a combination of WelChol and Crestor.

## 2011-07-01 NOTE — Assessment & Plan Note (Signed)
The patient is stable. Her EKG shows no significant ischemic changes. She has always had chest pains both before and after her coronary bypass surgery. Her pain is atypical and she can continue with medical therapy on aspirin, Cozaar, and metoprolol. We spoke at length about her continued tobacco use and the detrimental effects on her health and her breathing. She understands this and will try to quit but it is unlikely that she will be able to stop.

## 2011-07-01 NOTE — Assessment & Plan Note (Signed)
Controlled on current meds.

## 2011-07-04 ENCOUNTER — Other Ambulatory Visit: Payer: Self-pay | Admitting: *Deleted

## 2011-07-04 MED ORDER — ROSUVASTATIN CALCIUM 20 MG PO TABS: 20.0000 mg | ORAL_TABLET | Freq: Every day | ORAL | Status: DC

## 2011-07-07 NOTE — Progress Notes (Signed)
Addended by: Burnett Kanaris A on: 07/07/2011 02:09 PM   Modules accepted: Orders

## 2011-07-19 NOTE — Progress Notes (Signed)
Addended by: Burnett Kanaris A on: 07/19/2011 11:26 AM   Modules accepted: Orders

## 2011-08-16 ENCOUNTER — Telehealth: Payer: Self-pay | Admitting: Cardiovascular Disease

## 2011-08-16 NOTE — Telephone Encounter (Signed)
New msg Linda with [facility] has faxed request for refill 2 times for Losartan crestor welchol Please call her back

## 2011-08-16 NOTE — Telephone Encounter (Signed)
The pt has [employer] and they have changed their medication guidelines.  The pt's Crestor, Welchol and Losartan require a prior authorization or her medications will need to be changed. The pharmacy will fax the Medicaid approved medications to our office.

## 2011-08-24 NOTE — Telephone Encounter (Signed)
I spoke with Harrold Donath the pharmacist at [facility]:  D/C Crestor--->Atorvastatin 40mg  take one by mouth daily #30, refill 11 D/C Welchol--->no alternative prescribed D/C Losartan---->Lisinopril 20mg  take one by mouth daily #30, refill 11  I left the pt's daughter Lura Em a message to contact our office about the changes in her mother's medications.

## 2011-08-24 NOTE — Telephone Encounter (Signed)
I spoke with the pt's daughter Michelle Daniels and made her aware of medication changes.  She will contact [facility] about having the medications delivered to the pt's home.

## 2012-01-03 ENCOUNTER — Encounter: Payer: Self-pay | Admitting: Cardiovascular Disease

## 2012-01-03 ENCOUNTER — Ambulatory Visit (INDEPENDENT_AMBULATORY_CARE_PROVIDER_SITE_OTHER): Payer: Medicaid Other | Admitting: Cardiovascular Disease

## 2012-01-03 VITALS — BP 110/72 | HR 71 | Ht 68.0 in | Wt 237.4 lb

## 2012-01-03 DIAGNOSIS — I2510 Atherosclerotic heart disease of native coronary artery without angina pectoris: Secondary | ICD-10-CM

## 2012-01-03 MED ORDER — NITROGLYCERIN 0.4 MG SL SUBL: 0.4000 mg | SUBLINGUAL_TABLET | SUBLINGUAL | Status: DC | PRN

## 2012-01-03 NOTE — Progress Notes (Signed)
HPI:  66 year old woman presenting for followup evaluation. She is followed first coronary artery disease status post CABG, hypertension, and chronic tobacco use. Her bypass surgery was in 2011. She unfortunately continues to smoke cigarettes.  The patient has no new complaints. She does have occasional chest pain. It is unchanged in frequency or severity. The pain is midsternal and feels like an ache. It is unrelated to physical exertion. She has chronic shortness of breath with low-level activity. She also complains of mild right leg swelling that she relates to a previous leg injury. She has no orthopnea or PND.  Outpatient Encounter Prescriptions as of 01/03/2012  Medication Sig Dispense Refill  . amLODipine (NORVASC) 10 MG tablet Take 10 mg by mouth daily.      Marland Kitchen aspirin 81 MG tablet Take 81 mg by mouth daily.        Marland Kitchen atorvastatin (LIPITOR) 40 MG tablet Take 1 tablet (40 mg total) by mouth daily.      . cycloSPORINE (RESTASIS) 0.05 % ophthalmic emulsion Place 1 drop into both eyes 2 (two) times daily.        . fish oil-omega-3 fatty acids 1000 MG capsule Take 2 g by mouth daily.        . hydrochlorothiazide 25 MG tablet TAKE 1 TABLET BY MOUTH   ONCE A DAY.  30 tablet  9  . levothyroxine (SYNTHROID, LEVOTHROID) 25 MCG tablet Take 25 mcg by mouth daily.        Marland Kitchen lisinopril (PRINIVIL,ZESTRIL) 20 MG tablet Take 1 tablet (20 mg total) by mouth daily.      . metFORMIN (GLUCOPHAGE) 500 MG tablet Take 500 mg by mouth daily.        . metoprolol tartrate (LOPRESSOR) 25 MG tablet Take 25 mg by mouth 2 (two) times daily.        . nitroGLYCERIN (NITROSTAT) 0.4 MG SL tablet Place 1 tablet (0.4 mg total) under the tongue every 5 (five) minutes as needed.  25 tablet  6  . omeprazole (PRILOSEC) 20 MG capsule Take 20 mg by mouth daily.        Marland Kitchen DISCONTD: nitroGLYCERIN (NITROSTAT) 0.4 MG SL tablet Place 0.4 mg under the tongue every 5 (five) minutes as needed.          No Known Allergies  Past Medical  History  Diagnosis Date  . Coronary artery disease     s/p MI and prior PCI (bare metal stent RCA and DES in LAD)  . COPD (chronic obstructive pulmonary disease)   . Hypertension   . Dyslipidemia   . GERD (gastroesophageal reflux disease)     ROS: Negative except as per HPI  BP 110/72  Pulse 71  Ht 5\' 8"  (1.727 m)  Wt 107.684 kg (237 lb 6.4 oz)  BMI 36.10 kg/m2  PHYSICAL EXAM: Pt is alert and oriented, pleasant, obese woman in NAD HEENT: normal Neck: JVP - normal, carotids 2+= without bruits Lungs: Diffuse expiratory rhonchi CV: RRR without murmur or gallop Abd: soft, NT, Positive BS, obese Ext: 1+ edema on the right, trace edema on the left, distal pulses intact and equal Skin: warm/dry no rash  EKG:  Normal sinus rhythm 71 beats per minute, low-voltage QRS, cannot rule out anterior infarct age undetermined, cannot rule out inferior infarct age undetermined.  ASSESSMENT AND PLAN: 1. CAD status post CABG. The patient is stable without angina. She is on appropriate medical therapy with aspirin and a statin drug. She will continue her same  medicines and follow up in 6 months  2. Hypertension. Blood pressure is in the ideal range on a combination of hydrochlorothiazide, lisinopril, and metoprolol.  3. Tobacco use. Smoking cessation counseling was again done with the patient is very unlikely to ever quit.  4. Hyperlipidemia. The patient is followed by her primary care physician. The last lipids I have on file are from 2011 that showed a cholesterol of 167, HDL 45, and LDL of 98. The triglycerides were elevated at 251. Next  5. Followup. I would like to see the patient back in 6 months.  Tonny Bollman 01/03/2012 2:01 PM

## 2012-01-03 NOTE — Patient Instructions (Signed)
Your physician wants you to follow-up in:  6 months. You will receive a reminder letter in the mail two months in advance. If you don't receive a letter, please call our office to schedule the follow-up appointment.   

## 2012-01-28 ENCOUNTER — Other Ambulatory Visit: Payer: Self-pay | Admitting: *Deleted

## 2012-01-28 MED ORDER — HYDROCHLOROTHIAZIDE 25 MG PO TABS: 25.0000 mg | ORAL_TABLET | Freq: Every day | ORAL | Status: DC

## 2012-08-24 ENCOUNTER — Other Ambulatory Visit: Payer: Self-pay | Admitting: *Deleted

## 2012-10-03 ENCOUNTER — Other Ambulatory Visit: Payer: Self-pay | Admitting: *Deleted

## 2012-10-03 MED ORDER — LISINOPRIL 20 MG PO TABS: 20.0000 mg | ORAL_TABLET | Freq: Every day | ORAL | Status: DC

## 2012-10-03 MED ORDER — ATORVASTATIN CALCIUM 40 MG PO TABS: 40.0000 mg | ORAL_TABLET | Freq: Every day | ORAL | Status: DC

## 2012-12-05 ENCOUNTER — Other Ambulatory Visit (HOSPITAL_COMMUNITY): Payer: Self-pay | Admitting: Internal Medicine

## 2012-12-05 DIAGNOSIS — Z139 Encounter for screening, unspecified: Secondary | ICD-10-CM

## 2012-12-10 ENCOUNTER — Other Ambulatory Visit (HOSPITAL_COMMUNITY): Payer: Medicare Other

## 2012-12-10 ENCOUNTER — Ambulatory Visit (HOSPITAL_COMMUNITY): Payer: Medicare Other

## 2012-12-13 ENCOUNTER — Ambulatory Visit (HOSPITAL_COMMUNITY)
Admission: RE | Admit: 2012-12-13 | Discharge: 2012-12-13 | Disposition: A | Discharge status: Home / Self Care | Payer: Medicare Other | Source: Ambulatory Visit | Attending: Internal Medicine | Admitting: Internal Medicine

## 2012-12-13 DIAGNOSIS — Z13820 Encounter for screening for osteoporosis: Secondary | ICD-10-CM | POA: Insufficient documentation

## 2012-12-13 DIAGNOSIS — Z1231 Encounter for screening mammogram for malignant neoplasm of breast: Secondary | ICD-10-CM | POA: Insufficient documentation

## 2012-12-13 DIAGNOSIS — Z139 Encounter for screening, unspecified: Secondary | ICD-10-CM

## 2012-12-13 DIAGNOSIS — M899 Disorder of bone, unspecified: Secondary | ICD-10-CM | POA: Insufficient documentation

## 2012-12-13 DIAGNOSIS — Z780 Asymptomatic menopausal state: Secondary | ICD-10-CM | POA: Insufficient documentation

## 2012-12-13 DIAGNOSIS — M949 Disorder of cartilage, unspecified: Secondary | ICD-10-CM | POA: Insufficient documentation

## 2012-12-14 ENCOUNTER — Other Ambulatory Visit: Payer: Self-pay | Admitting: Internal Medicine

## 2012-12-14 DIAGNOSIS — R928 Other abnormal and inconclusive findings on diagnostic imaging of breast: Secondary | ICD-10-CM

## 2013-01-09 ENCOUNTER — Ambulatory Visit (HOSPITAL_COMMUNITY)
Admission: RE | Admit: 2013-01-09 | Discharge: 2013-01-09 | Disposition: A | Discharge status: Home / Self Care | Payer: Medicare Other | Source: Ambulatory Visit | Attending: Internal Medicine | Admitting: Internal Medicine

## 2013-01-09 ENCOUNTER — Other Ambulatory Visit: Payer: Self-pay | Admitting: Internal Medicine

## 2013-01-09 ENCOUNTER — Other Ambulatory Visit (HOSPITAL_COMMUNITY): Payer: Self-pay | Admitting: Internal Medicine

## 2013-01-09 DIAGNOSIS — R928 Other abnormal and inconclusive findings on diagnostic imaging of breast: Secondary | ICD-10-CM

## 2013-01-29 ENCOUNTER — Other Ambulatory Visit: Payer: Self-pay | Admitting: *Deleted

## 2013-01-29 MED ORDER — HYDROCHLOROTHIAZIDE 25 MG PO TABS: 25.0000 mg | ORAL_TABLET | Freq: Every day | ORAL | Status: DC

## 2013-02-28 ENCOUNTER — Other Ambulatory Visit (HOSPITAL_COMMUNITY): Payer: Self-pay | Admitting: Family Medicine

## 2013-02-28 ENCOUNTER — Ambulatory Visit (HOSPITAL_COMMUNITY)
Admission: RE | Admit: 2013-02-28 | Discharge: 2013-02-28 | Disposition: A | Discharge status: Home / Self Care | Payer: Medicare Other | Source: Ambulatory Visit | Attending: Family Medicine | Admitting: Family Medicine

## 2013-02-28 DIAGNOSIS — R0989 Other specified symptoms and signs involving the circulatory and respiratory systems: Secondary | ICD-10-CM

## 2013-02-28 DIAGNOSIS — R05 Cough: Secondary | ICD-10-CM | POA: Insufficient documentation

## 2013-02-28 DIAGNOSIS — R059 Cough, unspecified: Secondary | ICD-10-CM | POA: Insufficient documentation

## 2013-03-29 ENCOUNTER — Other Ambulatory Visit: Payer: Self-pay | Admitting: *Deleted

## 2013-03-29 MED ORDER — HYDROCHLOROTHIAZIDE 25 MG PO TABS: 25.0000 mg | ORAL_TABLET | Freq: Every day | ORAL | Status: DC

## 2013-07-02 ENCOUNTER — Other Ambulatory Visit: Payer: Self-pay

## 2013-07-02 MED ORDER — LISINOPRIL 20 MG PO TABS: 20.0000 mg | ORAL_TABLET | Freq: Every day | ORAL | Status: DC

## 2013-07-02 MED ORDER — ATORVASTATIN CALCIUM 40 MG PO TABS: 40.0000 mg | ORAL_TABLET | Freq: Every day | ORAL | Status: DC

## 2013-10-24 ENCOUNTER — Other Ambulatory Visit: Payer: Self-pay | Admitting: Cardiovascular Disease

## 2015-01-27 ENCOUNTER — Ambulatory Visit (INDEPENDENT_AMBULATORY_CARE_PROVIDER_SITE_OTHER): Payer: Medicare Other | Admitting: Cardiology

## 2015-01-27 ENCOUNTER — Encounter: Payer: Self-pay | Admitting: Cardiology

## 2015-01-27 VITALS — BP 158/82 | HR 72 | Ht 68.0 in | Wt 203.0 lb

## 2015-01-27 DIAGNOSIS — R0602 Shortness of breath: Secondary | ICD-10-CM

## 2015-01-27 DIAGNOSIS — I2510 Atherosclerotic heart disease of native coronary artery without angina pectoris: Secondary | ICD-10-CM

## 2015-01-27 DIAGNOSIS — J449 Chronic obstructive pulmonary disease, unspecified: Secondary | ICD-10-CM | POA: Diagnosis not present

## 2015-01-27 DIAGNOSIS — I10 Essential (primary) hypertension: Secondary | ICD-10-CM

## 2015-01-27 DIAGNOSIS — R600 Localized edema: Secondary | ICD-10-CM

## 2015-01-27 MED ORDER — FUROSEMIDE 20 MG PO TABS: 20.0000 mg | ORAL_TABLET | Freq: Every day | ORAL | Status: AC | PRN

## 2015-01-27 MED ORDER — ALBUTEROL SULFATE HFA 108 (90 BASE) MCG/ACT IN AERS: 2.0000 | INHALATION_SPRAY | Freq: Four times a day (QID) | RESPIRATORY_TRACT | Status: AC | PRN

## 2015-01-27 MED ORDER — EZETIMIBE 10 MG PO TABS: 5.0000 mg | ORAL_TABLET | Freq: Every day | ORAL | Status: DC

## 2015-01-27 NOTE — Progress Notes (Signed)
Clinical Summary [name] is a 69 y.o.female seen today as a new patient for the following medical problems.  1. CAD - hx of BMS to RCA and DES to LAD, with CABG in 11/2009 (LIMA-LAD,SVG-diag, SVG-OM). LVEF 50% at that time by LV gram. - has not followed up with cardiology in several years  - from Dr Randolm Idoloopers last notes long history of atypical chest pain.  -continues to  have occasional chest pain. She is a poor historian, description of symptoms is limited. Describes a tight pain, leftsided or midchest.Can occur at rest or with activity.. Occurs twice a day, can last 30 minutes to hour. Similar to chronic pain she has had for several years   2. COPD - hx of COPD per previous notes - still smoking, does not appear to be on any inhalers  3. HTN - does not check at home  4. Hyperlipidemia - from pcp notes statins cause muscle cramps, she has not been on   5. DM2 - followed by pcp  6. LE edema - ongoing for several years  Past Medical History  Diagnosis Date  . Coronary artery disease     s/p MI and prior PCI (bare metal stent RCA and DES in LAD)  . COPD (chronic obstructive pulmonary disease)   . Hypertension   . Dyslipidemia   . GERD (gastroesophageal reflux disease)      No Known Allergies   Current Outpatient Prescriptions  Medication Sig Dispense Refill  . amLODipine (NORVASC) 10 MG tablet Take 10 mg by mouth daily.    Marland Kitchen. aspirin 81 MG tablet Take 81 mg by mouth daily.      Marland Kitchen. atorvastatin (LIPITOR) 40 MG tablet TAKE ONE TABLET BY MOUTH ONCE DAILY. 30 tablet 0  . cycloSPORINE (RESTASIS) 0.05 % ophthalmic emulsion Place 1 drop into both eyes 2 (two) times daily.      . fish oil-omega-3 fatty acids 1000 MG capsule Take 2 g by mouth daily.      . hydrochlorothiazide (HYDRODIURIL) 25 MG tablet Take 1 tablet (25 mg total) by mouth daily. 30 tablet 0  . levothyroxine (SYNTHROID, LEVOTHROID) 25 MCG tablet Take 25 mcg by mouth daily.      Marland Kitchen. lisinopril  (PRINIVIL,ZESTRIL) 20 MG tablet TAKE ONE TABLET BY MOUTH ONCE DAILY. 30 tablet 0  . metFORMIN (GLUCOPHAGE) 500 MG tablet Take 500 mg by mouth daily.      . metoprolol tartrate (LOPRESSOR) 25 MG tablet Take 25 mg by mouth 2 (two) times daily.      . nitroGLYCERIN (NITROSTAT) 0.4 MG SL tablet Place 1 tablet (0.4 mg total) under the tongue every 5 (five) minutes as needed. 25 tablet 6  . omeprazole (PRILOSEC) 20 MG capsule Take 20 mg by mouth daily.       No current facility-administered medications for this visit.     Past Surgical History  Procedure Laterality Date  . Coronary artery bypass graft  2011    for severe left main stenosis     No Known Allergies    Family History  Problem Relation Age of Onset  . Other      negative for CAD     Social History [name] reports that she has been smoking Cigarettes.  She does not have any smokeless tobacco history on file. [name] reports that she does not drink alcohol.   Review of Systems CONSTITUTIONAL: No weight loss, fever, chills, weakness or fatigue.  HEENT: Eyes: No visual  loss, blurred vision, double vision or yellow sclerae.No hearing loss, sneezing, congestion, runny nose or sore throat.  SKIN: No rash or itching.  CARDIOVASCULAR: per HPI RESPIRATORY: No shortness of breath, cough or sputum.  GASTROINTESTINAL: No anorexia, nausea, vomiting or diarrhea. No abdominal pain or blood.  GENITOURINARY: No burning on urination, no polyuria NEUROLOGICAL: No headache, dizziness, syncope, paralysis, ataxia, numbness or tingling in the extremities. No change in bowel or bladder control.  MUSCULOSKELETAL: No muscle, back pain, joint pain or stiffness.  LYMPHATICS: No enlarged nodes. No history of splenectomy.  PSYCHIATRIC: No history of depression or anxiety.  ENDOCRINOLOGIC: No reports of sweating, cold or heat intolerance. No polyuria or polydipsia.  Marland Kitchen   Physical Examination Filed Vitals:   01/27/15 1005  BP:  158/82  Pulse: 72   Filed Vitals:   01/27/15 1005  Height:  (1.727 m)  Weight: 203 lb (92.08 kg)    Gen: resting comfortably, no acute distress HEENT: no scleral icterus, pupils equal round and reactive, no palptable cervical adenopathy,  CV: RRR, no m/r/g, no JVD Resp: Clear to auscultation bilaterally GI: abdomen is soft, non-tender, non-distended, normal bowel sounds, no hepatosplenomegaly MSK: extremities are warm,1+ bilateral LE edema Skin: warm, no rash Neuro:  no focal deficits Psych: appropriate affect   Diagnostic Studies 09/2006 Carotid US IMPRESSION: Minimal plaque formation without evidence of hemodynamically significant stenosis in visualized portions of carotid systems. Elevated peak systolic velocity in left CCA with slightly increased end-diastolic velocity and peak systolic velocity left ICA. These could reflect proximal stenosis in left common carotid artery proximal to point visualization by sonography. Consider carotid CTA or MRA to assess for possible proximal left CCA stenosis/atherosclerotic disease.  Echo 2008 A 2-D echo with echo Doppler: The left ventricle is normal in size with an end-diastolic dimension of 49-mm, interventricular septum is mildly thickened at 12-mm. Posterior wall is mildly thickened at 13-mm.  Right ventricle is normal in size.  Left atrium is mildly dilated at 43-mm. Right atrium is normal.  The aortic valve is mildly thickened. There is no significant stenosis. No insufficiency. Mitral valve has minimal thickening. There is mild annular calcification. There is trace insufficiency. Pulmonic valve not well seen. Tricuspid valve is normal with no insufficiency.  Overall LV EF is approximately 55% with question some septal hypokinesis. Windows are somewhat difficult. Overall, does not appear to be significant change from previous.  RV EF is normal.  No pericardial effusion is  seen.    Assessment and Plan  1. CAD - hx of prior PCIs, history of CABG in 2011 - long history of atypical chest pain that is unchanged - continue current meds  2. COPD - diagnosis according to prior notes, she is not on any therapy currently, do no see PFTs in system.  - defer to pcp, will give prn albuterol as she does describe wheezing at times  3. HTN - elevated in clinic, was at goal at recent pcp visit. Will continue to monitor, suspect white coat HTN today as she is very anxious  4. Leg edema - start prn lasix - obtain echo in setting of known CAD and LE edema, do not see recent evaluation of systolic or diastolic function on file  5. Hyperlipidemia - statins causes muscle cramps according to pcp note. Will start zetia  daily.  F/u 4 months      Antoine Poche, M.D.

## 2015-01-27 NOTE — Patient Instructions (Signed)
Your physician recommends that you schedule a follow-up appointment in: 4 months   START Zetia 5 mg daily  Take lasix 20 mg daily AS NEEDED for leg swelling   Use Albuterol inhaler as directed   Your physician has requested that you have an echocardiogram. Echocardiography is a painless test that uses sound waves to create images of your heart. It provides your doctor with information about the size and shape of your heart and how well your heart's chambers and valves are working. This procedure takes approximately one hour. There are no restrictions for this procedure.     Thank you for choosing [facility] !

## 2015-01-29 ENCOUNTER — Other Ambulatory Visit (HOSPITAL_COMMUNITY): Payer: Medicare Other

## 2015-01-30 ENCOUNTER — Ambulatory Visit (HOSPITAL_COMMUNITY)
Admission: RE | Admit: 2015-01-30 | Discharge: 2015-01-30 | Disposition: A | Discharge status: Home / Self Care | Payer: Medicare Other | Source: Ambulatory Visit | Attending: Cardiology | Admitting: Cardiology

## 2015-01-30 DIAGNOSIS — R0602 Shortness of breath: Secondary | ICD-10-CM | POA: Diagnosis present

## 2015-02-27 ENCOUNTER — Ambulatory Visit (INDEPENDENT_AMBULATORY_CARE_PROVIDER_SITE_OTHER): Payer: Medicare Other | Admitting: Cardiology

## 2015-02-27 ENCOUNTER — Encounter: Payer: Self-pay | Admitting: Cardiology

## 2015-02-27 VITALS — BP 146/70 | HR 71 | Ht 68.0 in | Wt 200.0 lb

## 2015-02-27 DIAGNOSIS — I5022 Chronic systolic (congestive) heart failure: Secondary | ICD-10-CM

## 2015-02-27 DIAGNOSIS — I2510 Atherosclerotic heart disease of native coronary artery without angina pectoris: Secondary | ICD-10-CM

## 2015-02-27 DIAGNOSIS — R0602 Shortness of breath: Secondary | ICD-10-CM

## 2015-02-27 DIAGNOSIS — J449 Chronic obstructive pulmonary disease, unspecified: Secondary | ICD-10-CM | POA: Diagnosis not present

## 2015-02-27 DIAGNOSIS — I10 Essential (primary) hypertension: Secondary | ICD-10-CM | POA: Diagnosis not present

## 2015-02-27 DIAGNOSIS — R600 Localized edema: Secondary | ICD-10-CM

## 2015-02-27 MED ORDER — METOPROLOL SUCCINATE ER 50 MG PO TB24: 50.0000 mg | ORAL_TABLET | Freq: Every day | ORAL | Status: AC

## 2015-02-27 NOTE — Patient Instructions (Addendum)
Your physician recommends that you schedule a follow-up appointment in: 1 month with Joni ReiningKathryn Lawrence NP   Stop Lopressor   START Toprol XL 25 mg daily   Take over the counter Zantac (ranitidine) 150 mg twice a day   Your physician has recommended that you have a pulmonary function test. Pulmonary Function Tests are a group of tests that measure how well air moves in and out of your lungs.  Thank you for choosing [facility] !

## 2015-02-27 NOTE — Progress Notes (Signed)
Clinical Summary Michelle Daniels is a 69 y.o.female seen today for follow up of the following medical problems.   1. CAD - hx of BMS to RCA and DES to LAD, with CABG in 11/2009 (LIMA-LAD,SVG-diag, SVG-OM). LVEF 50% at that time by LV gram. -  long history of atypical chest pain.  -continues to have occasional chest pain. She is a poor historian, description of symptoms is limited. Currently describes some chest pain at times after eating.   2. COPD - hx of COPD per previous notes - do not see PFTs in system - only on prn albuterol currently  3. HTN - does not check at home  4. Hyperlipidemia - from pcp notes statins cause muscle cramps, she has not been on therapy  5. DM2 - followed by pcp  6. LE edema - ongoing for several years - LE edema improved with lasix.       Past Medical History  Diagnosis Date  . Coronary artery disease     s/p MI and prior PCI (bare metal stent RCA and DES in LAD)  . COPD (chronic obstructive pulmonary disease)   . Hypertension   . Dyslipidemia   . GERD (gastroesophageal reflux disease)      No Known Allergies   Current Outpatient Prescriptions  Medication Sig Dispense Refill  . albuterol (PROVENTIL HFA;VENTOLIN HFA) 108 (90 BASE) MCG/ACT inhaler Inhale 2 puffs into the lungs every 6 (six) hours as needed for wheezing or shortness of breath. 1 Inhaler 2  . aspirin 81 MG tablet Take 81 mg by mouth daily.      Marland Kitchen ezetimibe (ZETIA) 10 MG tablet Take 0.5 tablets (5 mg total) by mouth daily. 45 tablet 3  . fenofibrate (TRICOR) 145 MG tablet     . fish oil-omega-3 fatty acids 1000 MG capsule Take 2 g by mouth daily.      . furosemide (LASIX) 20 MG tablet Take 1 tablet (20 mg total) by mouth daily as needed. 90 tablet 3  . levothyroxine (SYNTHROID, LEVOTHROID) 25 MCG tablet Take 25 mcg by mouth daily.      Marland Kitchen losartan (COZAAR) 100 MG tablet     . metoprolol tartrate (LOPRESSOR) 25 MG tablet Take 25 mg by mouth 2 (two) times daily.        No current facility-administered medications for this visit.     Past Surgical History  Procedure Laterality Date  . Coronary artery bypass graft  2011    for severe left main stenosis     No Known Allergies    Family History  Problem Relation Age of Onset  . Other      negative for CAD     Social History Ms. Spargo reports that she has been smoking Cigarettes.  She does not have any smokeless tobacco history on file. Ms. Stoneberg reports that she does not drink alcohol.   Review of Systems CONSTITUTIONAL: No weight loss, fever, chills, weakness or fatigue.  HEENT: Eyes: No visual loss, blurred vision, double vision or yellow sclerae.No hearing loss, sneezing, congestion, runny nose or sore throat.  SKIN: No rash or itching.  CARDIOVASCULAR: per HPI RESPIRATORY: No cough or sputum.  GASTROINTESTINAL: No anorexia, nausea, vomiting or diarrhea. No abdominal pain or blood.  GENITOURINARY: No burning on urination, no polyuria NEUROLOGICAL: No headache, dizziness, syncope, paralysis, ataxia, numbness or tingling in the extremities. No change in bowel or bladder control.  MUSCULOSKELETAL: No muscle, back pain, joint pain or stiffness.  LYMPHATICS:  No enlarged nodes. No history of splenectomy.  PSYCHIATRIC: No history of depression or anxiety.  ENDOCRINOLOGIC: No reports of sweating, cold or heat intolerance. No polyuria or polydipsia.  Marland Kitchen.   Physical Examination Filed Vitals:   02/27/15 1450  BP: 146/70  Pulse: 71   Filed Vitals:   02/27/15 1450  Height: 5\' 8"  (1.727 m)  Weight: 200 lb (90.719 kg)    Gen: resting comfortably, no acute distress HEENT: no scleral icterus, pupils equal round and reactive, no palptable cervical adenopathy,  CV: RRR, no m/r/g, no JVD Resp: bilateral wheezing GI: abdomen is soft, non-tender, non-distended, normal bowel sounds, no hepatosplenomegaly MSK: extremities are warm, no edema.  Skin: warm, no rash Neuro:  no focal  deficits Psych: appropriate affect   Diagnostic Studies 09/2006 Carotid US IMPRESSION: Minimal plaque formation without evidence of hemodynamically significant stenosis in visualized portions of carotid systems. Elevated peak systolic velocity in left CCA with slightly increased end-diastolic velocity and peak systolic velocity left ICA. These could reflect proximal stenosis in left common carotid artery proximal to point visualization by sonography. Consider carotid CTA or MRA to assess for possible proximal left CCA stenosis/atherosclerotic disease.  Echo 2008 A 2-D echo with echo Doppler: The left ventricle is normal in size with an end-diastolic dimension of 49-mm, interventricular septum is mildly thickened at 12-mm. Posterior wall is mildly thickened at 13-mm.  Right ventricle is normal in size.  Left atrium is mildly dilated at 43-mm. Right atrium is normal.  The aortic valve is mildly thickened. There is no significant stenosis. No insufficiency. Mitral valve has minimal thickening. There is mild annular calcification. There is trace insufficiency. Pulmonic valve not well seen. Tricuspid valve is normal with no insufficiency.  Overall LV EF is approximately 55% with question some septal hypokinesis. Windows are somewhat difficult. Overall, does not appear to be significant change from previous.  RV EF is normal.  No pericardial effusion is seen.   01/2015 - Left ventricle: The cavity size was normal. Wall thickness was increased in a pattern of mild LVH. Systolic function was moderately reduced. The estimated ejection fraction was = 40%. Doppler parameters are consistent with abnormal left ventricular relaxation (grade 1 diastolic dysfunction). - Aortic valve: Mildly calcified annulus. Trileaflet; mildly thickened leaflets. Valve area (VTI): 1.98 cm^2. Valve area (Vmax): 1.98 cm^2. - Mitral valve: Mildly calcified  annulus. Mildly thickened leaflets . There was mild regurgitation. - Left atrium: The atrium was moderately dilated. - Right ventricle: Systolic function was mildly reduced. - Atrial septum: No defect or patent foramen ovale was identified. - Technically adequate study.  Assessment and Plan   1. CAD/ICM - hx of prior PCIs, history of CABG in 2011 - long history of atypical chest pain that is unchanged - continue current meds - recommened OTC zantac for current symptoms suggestive or GI etiology - recent echo with LVEf 40%, will change lopressor to Toprol XL 50mg  daily. Titrate meds at f/u for systolic dysfunction.   2. COPD - diagnosis according to prior notes, she is not on any therapy currently, do no see PFTs in system.  - wheezing on exam, notes some SOB. Will order PFTs  3. HTN - above goal, follow as we change her to Toprol XL today  4. Leg edema - continue prn lasix  5. Hyperlipidemia - request lipid panel from pcp, may consider zetia.    F/u 4 weeks   Antoine PocheJonathan F. Johnson Arizola, M.D., F.A.C.C.

## 2015-03-20 ENCOUNTER — Ambulatory Visit (HOSPITAL_COMMUNITY)
Admission: RE | Admit: 2015-03-20 | Discharge: 2015-03-20 | Disposition: A | Discharge status: Home / Self Care | Payer: Medicare Other | Source: Ambulatory Visit | Attending: Cardiology | Admitting: Cardiology

## 2015-03-20 DIAGNOSIS — R0602 Shortness of breath: Secondary | ICD-10-CM

## 2015-03-23 ENCOUNTER — Other Ambulatory Visit (HOSPITAL_COMMUNITY): Payer: Self-pay | Admitting: Respiratory Therapy

## 2015-03-27 ENCOUNTER — Ambulatory Visit (HOSPITAL_COMMUNITY)
Admission: RE | Admit: 2015-03-27 | Discharge: 2015-03-27 | Disposition: A | Discharge status: Home / Self Care | Payer: Medicare Other | Source: Ambulatory Visit | Attending: Cardiology | Admitting: Cardiology

## 2015-03-27 ENCOUNTER — Encounter (HOSPITAL_COMMUNITY): Payer: Medicare Other

## 2015-03-27 DIAGNOSIS — R0602 Shortness of breath: Secondary | ICD-10-CM | POA: Diagnosis not present

## 2015-03-31 LAB — PULMONARY FUNCTION TEST
DL/VA % pred: 84 %
DL/VA: 4.44 ml/min/mmHg/L
DLCO UNC % PRED: 45 %
DLCO unc: 13.49 ml/min/mmHg
FEF 25-75 PRE: 0.97 L/s
FEF2575-%PRED-PRE: 43 %
FEV1-%PRED-PRE: 45 %
FEV1-PRE: 1.25 L
FEV1FVC-%Pred-Pre: 99 %
FEV6-%Pred-Pre: 48 %
FEV6-Pre: 1.64 L
FEV6FVC-%PRED-PRE: 104 %
FVC-%Pred-Pre: 46 %
PRE FEV1/FVC RATIO: 76 %
PRE FEV6/FVC RATIO: 100 %
RV % pred: 122 %
RV: 2.88 L
TLC % pred: 79 %
TLC: 4.47 L

## 2015-04-06 ENCOUNTER — Encounter: Payer: Self-pay | Admitting: Adult Health

## 2015-04-06 ENCOUNTER — Ambulatory Visit (INDEPENDENT_AMBULATORY_CARE_PROVIDER_SITE_OTHER): Payer: Medicare Other | Admitting: Adult Health

## 2015-04-06 VITALS — BP 138/82 | HR 89 | Ht 68.0 in | Wt 200.0 lb

## 2015-04-06 DIAGNOSIS — Z720 Tobacco use: Secondary | ICD-10-CM

## 2015-04-06 DIAGNOSIS — F17210 Nicotine dependence, cigarettes, uncomplicated: Secondary | ICD-10-CM | POA: Diagnosis not present

## 2015-04-06 DIAGNOSIS — I2510 Atherosclerotic heart disease of native coronary artery without angina pectoris: Secondary | ICD-10-CM

## 2015-04-06 DIAGNOSIS — R942 Abnormal results of pulmonary function studies: Secondary | ICD-10-CM

## 2015-04-06 NOTE — Progress Notes (Deleted)
Name: Michelle Daniels    DOB: [birth year]  Age: 69 y.o.  MR#: [id number]       PCP:  Glo Herring., MD      Insurance: Payor: MEDICARE / Plan: MEDICARE PART A AND B / Product Type: *No Product type* /   CC:    Chief Complaint  Patient presents with  . Coronary Artery Disease  . COPD  . Hypertension    VS Filed Vitals:   04/06/15 1316  BP: 138/82  Pulse: 89  Height: 5' 8"  (1.727 m)  Weight: 200 lb (90.719 kg)  SpO2: 95%    Weights Current Weight  04/06/15 200 lb (90.719 kg)  02/27/15 200 lb (90.719 kg)  01/27/15 203 lb (92.08 kg)    Blood Pressure  BP Readings from Last 3 Encounters:  04/06/15 138/82  02/27/15 146/70  01/27/15 158/82     Admit date:  (Not on file) Last encounter with RMR:  Visit date not found   Allergy Review of patient's allergies indicates no known allergies.  Current Outpatient Prescriptions  Medication Sig Dispense Refill  . albuterol (PROVENTIL HFA;VENTOLIN HFA) 108 (90 BASE) MCG/ACT inhaler Inhale 2 puffs into the lungs every 6 (six) hours as needed for wheezing or shortness of breath. 1 Inhaler 2  . aspirin 81 MG tablet Take 81 mg by mouth daily.      . fenofibrate (TRICOR) 145 MG tablet Take 145 mg by mouth daily.     . fish oil-omega-3 fatty acids 1000 MG capsule Take 2 g by mouth daily.      . furosemide (LASIX) 20 MG tablet Take 1 tablet (20 mg total) by mouth daily as needed. 90 tablet 3  . levothyroxine (SYNTHROID, LEVOTHROID) 25 MCG tablet Take 25 mcg by mouth daily.      Marland Kitchen losartan (COZAAR) 100 MG tablet Take 100 mg by mouth daily.     . metoprolol succinate (TOPROL-XL) 50 MG 24 hr tablet Take 1 tablet (50 mg total) by mouth daily. Take with or immediately following a meal. 90 tablet 3   No current facility-administered medications for this visit.    Discontinued Meds:   There are no discontinued medications.  Patient Active Problem List   Diagnosis Date Noted  . CAD, ARTERY BYPASS GRAFT 05/17/2010  . ATRIAL FIBRILLATION  01/18/2010  . HYPERLIPIDEMIA-MIXED 09/22/2008  . HYPERTENSION, BENIGN 09/22/2008    LABS    Component Value Date/Time   NA 143 07/19/2010 0952   NA 142 04/27/2010 1919   NA 144 03/05/2010 1924   K 3.9 07/19/2010 0952   K 4.2 04/27/2010 1919   K 5.2 03/05/2010 1924   CL 104 07/19/2010 0952   CL 106 04/27/2010 1919   CL 108 03/05/2010 1924   CO2 30 07/19/2010 0952   CO2 26 04/27/2010 1919   CO2 27 03/05/2010 1924   GLUCOSE 104* 07/19/2010 0952   GLUCOSE 98 04/27/2010 1919   GLUCOSE 116* 03/05/2010 1924   BUN 20 07/19/2010 0952   BUN 12 04/27/2010 1919   BUN 9 03/05/2010 1924   CREATININE 0.9 07/19/2010 0952   CREATININE 0.67 04/27/2010 1919   CREATININE 0.68 03/05/2010 1924   CALCIUM 9.2 07/19/2010 0952   CALCIUM 9.2 04/27/2010 1919   CALCIUM 9.9 03/05/2010 1924   GFRNONAA 71.71 07/19/2010 0952   GFRNONAA 101.45 02/15/2010 0000   GFRNONAA >60 12/13/2009 0442   GFRAA  12/13/2009 0442    >60        The eGFR has been  calculated using the MDRD equation. This calculation has not been validated in all clinical situations. eGFR's persistently <60 mL/min signify possible Chronic Kidney Disease.   GFRAA  12/11/2009 0400    >60        The eGFR has been calculated using the MDRD equation. This calculation has not been validated in all clinical situations. eGFR's persistently <60 mL/min signify possible Chronic Kidney Disease.   GFRAA  12/10/2009 0335    >60        The eGFR has been calculated using the MDRD equation. This calculation has not been validated in all clinical situations. eGFR's persistently <60 mL/min signify possible Chronic Kidney Disease.   CMP     Component Value Date/Time   NA 143 07/19/2010 0952   K 3.9 07/19/2010 0952   CL 104 07/19/2010 0952   CO2 30 07/19/2010 0952   GLUCOSE 104* 07/19/2010 0952   BUN 20 07/19/2010 0952   CREATININE 0.9 07/19/2010 0952   CALCIUM 9.2 07/19/2010 0952   PROT 6.6 05/17/2010 0930   ALBUMIN 4.0  05/17/2010 0930   AST 23 05/17/2010 0930   ALT 18 05/17/2010 0930   ALKPHOS 57 05/17/2010 0930   BILITOT 0.3 05/17/2010 0930   GFRNONAA 71.71 07/19/2010 0952   GFRAA  12/13/2009 0442    >60        The eGFR has been calculated using the MDRD equation. This calculation has not been validated in all clinical situations. eGFR's persistently <60 mL/min signify possible Chronic Kidney Disease.       Component Value Date/Time   WBC 10.7* 12/13/2009 0442   WBC 12.2* 12/11/2009 0400   WBC 12.6* 12/10/2009 0335   HGB 10.6* 12/13/2009 0442   HGB 11.3* 12/11/2009 0400   HGB 10.6* 12/10/2009 0335   HCT 30.0* 12/13/2009 0442   HCT 32.3* 12/11/2009 0400   HCT 30.8* 12/10/2009 0335   MCV 95.5 12/13/2009 0442   MCV 95.2 12/11/2009 0400   MCV 95.3 12/10/2009 0335    Lipid Panel     Component Value Date/Time   CHOL 167 05/17/2010 0930   TRIG 251.0* 05/17/2010 0930   HDL 45.10 05/17/2010 0930   CHOLHDL 4 05/17/2010 0930   VLDL 50.2* 05/17/2010 0930   LDLDIRECT 98.1 05/17/2010 0930    ABG    Component Value Date/Time   PHART 7.419* 12/09/2009 0339   PCO2ART 37.7 12/09/2009 0339   PO2ART 66.0* 12/09/2009 0339   HCO3 24.4* 12/09/2009 0339   TCO2 26 12/09/2009 0339   ACIDBASEDEF 1.0 12/08/2009 1759   O2SAT 93.0 12/09/2009 0339     No results found for: TSH BNP (last 3 results) No results for input(s): BNP in the last 8760 hours.  ProBNP (last 3 results) No results for input(s): PROBNP in the last 8760 hours.  Cardiac Panel (last 3 results) No results for input(s): CKTOTAL, CKMB, TROPONINI, RELINDX in the last 72 hours.  Iron/TIBC/Ferritin/ %Sat No results found for: IRON, TIBC, FERRITIN, IRONPCTSAT   EKG Orders placed or performed in visit on 02/27/15  . EKG 12-Lead     Prior Assessment and Plan Problem List as of 04/06/2015      Cardiovascular and Mediastinum   HYPERTENSION, BENIGN   Last Assessment & Plan 07/01/2011 Office Visit Written 07/01/2011 10:28 AM by  Sherren Mocha, MD    Controlled on current meds      CAD, ARTERY BYPASS GRAFT   Last Assessment & Plan 07/01/2011 Office Visit Written 07/01/2011 10:28 AM by Sherren Mocha, MD  The patient is stable. Her EKG shows no significant ischemic changes. She has always had chest pains both before and after her coronary bypass surgery. Her pain is atypical and she can continue with medical therapy on aspirin, Cozaar, and metoprolol. We spoke at length about her continued tobacco use and the detrimental effects on her health and her breathing. She understands this and will try to quit but it is unlikely that she will be able to stop.      ATRIAL FIBRILLATION     Other   HYPERLIPIDEMIA-MIXED   Last Assessment & Plan 07/01/2011 Office Visit Written 07/01/2011 10:29 AM by Sherren Mocha, MD    Lipids are followed by her primary care physician. She is on a combination of WelChol and Crestor.          Imaging: No results found.

## 2015-04-06 NOTE — Progress Notes (Signed)
Cardiology Office Note   Date:  04/06/2015   ID:  MERLIN EGE, DOB 09/05/1945, MRN [id number]  PCP:  Cassell Smiles., MD  Cardiologist:  Arlington Calix, NP   Chief Complaint  Patient presents with  . Coronary Artery Disease  . COPD  . Hypertension      History of Present Illness: Michelle Daniels is a 69 y.o. female who presents for ongoing assessment and management of coronary artery disease, history of bare metal stent to the right coronary artery, drug-eluting stent to the LAD, and CABG in 2011, (LIMA to LAD, SVG to diagonal, SVG to OM.  The patient has a long history of atypical chest pain.  He was last seen in the office on 02/27/2015.  He is recommended for treatment 4.  GERD with over-the-counter Zantac.  The symptoms were suggestive GI etiology.  Most recent echocardiogram revealed an LVEF of 40, percent, changed Lopressor to Toprol 50 mg daily. He is also scheduled for PFTs.  These are found to be abnormal.  She comes today with continued cough and congestion.  Symptoms.  Continue shortness of breath with dyspnea on exertion.  She denies any chest pain, dizziness, or rapid heart rhythm.  The patient is of low intellect, and has trouble expressing herself.  Family member, who is with her, states that she smokes heavily throughout the day.  Past Medical History  Diagnosis Date  . Coronary artery disease     s/p MI and prior PCI (bare metal stent RCA and DES in LAD)  . COPD (chronic obstructive pulmonary disease)   . Hypertension   . Dyslipidemia   . GERD (gastroesophageal reflux disease)     Past Surgical History  Procedure Laterality Date  . Coronary artery bypass graft  2011    for severe left main stenosis     Current Outpatient Prescriptions  Medication Sig Dispense Refill  . albuterol (PROVENTIL HFA;VENTOLIN HFA) 108 (90 BASE) MCG/ACT inhaler Inhale 2 puffs into the lungs every 6 (six) hours as needed for wheezing or shortness of breath. 1 Inhaler 2  .  aspirin 81 MG tablet Take 81 mg by mouth daily.      . fenofibrate (TRICOR) 145 MG tablet Take 145 mg by mouth daily.     . fish oil-omega-3 fatty acids 1000 MG capsule Take 2 g by mouth daily.      . furosemide (LASIX) 20 MG tablet Take 1 tablet (20 mg total) by mouth daily as needed. 90 tablet 3  . levothyroxine (SYNTHROID, LEVOTHROID) 25 MCG tablet Take 25 mcg by mouth daily.      Marland Kitchen losartan (COZAAR) 100 MG tablet Take 100 mg by mouth daily.     . metoprolol succinate (TOPROL-XL) 50 MG 24 hr tablet Take 1 tablet (50 mg total) by mouth daily. Take with or immediately following a meal. 90 tablet 3   No current facility-administered medications for this visit.    Allergies:   Review of patient's allergies indicates no known allergies.    Social History:  The patient  reports that she has been smoking Cigarettes.  She does not have any smokeless tobacco history on file. She reports that she does not drink alcohol or use illicit drugs.   Family History:  The patient's family history includes Other in an other family member.    ROS: .   All other systems are reviewed and negative.Unless otherwise mentioned in  H&P above.   PHYSICAL EXAM: VS:  There were no  vitals taken for this visit. , BMI There is no weight on file to calculate BMI. GEN: Well nourished, well developed, in no acute distress HEENT: normal Neck: no JVD, carotid bruits, or masses Cardiac: RRR; no murmurs, rubs, or gallops,no edema  Respiratory:  Bilateral rales and rhonchi. Frequent coughing, GI: soft, nontender, nondistended, + BS MS: no deformity or atrophyDependent edema.  Skin: warm and dry, no rash Neuro:  Strength and sensation are intact Psych: euthymic mood, full affect  Lipid Panel    Component Value Date/Time   CHOL 167 05/17/2010 0930   TRIG 251.0* 05/17/2010 0930   HDL 45.10 05/17/2010 0930   CHOLHDL 4 05/17/2010 0930   VLDL 50.2* 05/17/2010 0930   LDLDIRECT 98.1 05/17/2010 0930      Wt Readings  from Last 3 Encounters:  02/27/15 200 lb (90.719 kg)  01/27/15 203 lb (92.08 kg)  01/03/12 237 lb 6.4 oz (107.684 kg)      Other studies Reviewed: Additional studies/ records that were reviewed today include: None Review of the above records demonstrates: N/A   ASSESSMENT AND PLAN:  1.  CAD: she is without cardiac complaints.  The patient has no chest pain, palpitations, or weakness.  She will continue on her current cardiac medication regimen, to include aspirin, fish oil, and metoprolol 50 mg daily.  She will be seen again in 6 months unless having worsening cardiac symptoms.  2. COPD: she continues to have dyspnea on exertion, frequent coughing, congestion.  Most recent echocardiogram in June of 2016 demonstrated ejection fraction of 40%.  She will continue on Lasix, 20 milligrams by mouth daily.  She is only using it when necessary, but continues to have lower extremity edema.  She may need to be taking this every other day, which has been recommended. She is being referred to [facility] pulmonology for more recommedations a treatments   3.  Ongoing tobacco abuse: I have spent greater than 10 minutes talking with this patient a family member.tobacco abuse and its contribution to her lung disease.  She states that she smokes 1-1/2 packs a day, and has been doing so for approximately 40 years.  She is not inclined to quit at this time.  I discussed risks and benefits a contained a smoker, versus cessation.  She verbalizes understanding.   Current medicines are reviewed at length with the patient today.    Labs/ tests ordered today include: NOne No orders of the defined types were placed in this encounter.     Disposition:   FU with 6 months.  Signed, Joni Reining, NP  04/06/2015 7:03 AM    [facility] [address], [place], [place] [address] Phone: [phone]; Fax: [phone]

## 2015-04-06 NOTE — Patient Instructions (Signed)
Your physician wants you to follow-up in: 6 months with Joni Reining, NP. You will receive a reminder letter in the mail two months in advance. If you don't receive a letter, please call our office to schedule the follow-up appointment.  Your physician recommends that you continue on your current medications as directed. Please refer to the Current Medication list given to you today.  You have been referred to Pulmonology  Thank you for choosing [facility]!

## 2015-04-22 ENCOUNTER — Encounter: Payer: Self-pay | Admitting: Pulmonary Disease

## 2015-04-22 ENCOUNTER — Ambulatory Visit (INDEPENDENT_AMBULATORY_CARE_PROVIDER_SITE_OTHER): Payer: Medicare Other | Admitting: Pulmonary Disease

## 2015-04-22 VITALS — BP 130/80 | HR 64 | Ht 67.0 in | Wt 200.8 lb

## 2015-04-22 DIAGNOSIS — R0602 Shortness of breath: Secondary | ICD-10-CM

## 2015-04-22 DIAGNOSIS — I5022 Chronic systolic (congestive) heart failure: Secondary | ICD-10-CM

## 2015-04-22 DIAGNOSIS — I2510 Atherosclerotic heart disease of native coronary artery without angina pectoris: Secondary | ICD-10-CM | POA: Diagnosis not present

## 2015-04-22 DIAGNOSIS — J432 Centrilobular emphysema: Secondary | ICD-10-CM | POA: Insufficient documentation

## 2015-04-22 NOTE — Progress Notes (Signed)
Subjective:    Patient ID: Michelle Daniels, female    DOB: 1946-02-17, 69 y.o.   MRN: [id number]  HPI Chief Complaint  Patient presents with  . Pulmonary Consult    Referred by Dr Wyline Mood for abnormal PFT. PFT done 02/2015. Recent EKG 02/2015 Pt c/o worsening SOB with exertion, and chronic non-productive cough. Pt also states worsening fatigue x 6 months.     This is a pleasant 69 year old female who comes her clinic today for evaluation of shortness of breath. She said that the shortness of breath has been persistent for at least a year and is severe in nature. It occurs with any activity. She has a cough but not much sputum production. She feels that she has chest congestion that will clear. She does not have sinus symptoms or heartburn. She has smoked for many years and continues to smoke one half packs of cigarettes daily. She worked on tobacco forms over the years. She has never exercised and does not now. She recently had a heart evaluation and was found to have heart failure. She said that she's been having leg swelling this year Mr. medically improved since starting to take diuretic medications.  She has not had lung function testing in the past that she is aware of. She is accompanied by a friend who helps her with the history.  Sounds like she can lie down flat without too much difficulty. She denies chest pain.  Past Medical History  Diagnosis Date  . Coronary artery disease     s/p MI and prior PCI (bare metal stent RCA and DES in LAD)  . COPD (chronic obstructive pulmonary disease)   . Hypertension   . Dyslipidemia   . GERD (gastroesophageal reflux disease)      Family History  Problem Relation Age of Onset  . Other      negative for CAD     Social History   Social History  . Marital Status: Widowed    Spouse Name: N/A  . Number of Children: N/A  . Years of Education: N/A   Occupational History  . chronically-ill,sedentary   . disabled    Social History Main  Topics  . Smoking status: Current Some Day Smoker -- 2.00 packs/day for 45 years    Types: Cigarettes    Start date: 04/05/1977  . Smokeless tobacco: Not on file     Comment: heavy tobacco history  . Alcohol Use: No  . Drug Use: No  . Sexual Activity: Not on file   Other Topics Concern  . Not on file   Social History Narrative     No Known Allergies   Outpatient Prescriptions Prior to Visit  Medication Sig Dispense Refill  . albuterol (PROVENTIL HFA;VENTOLIN HFA) 108 (90 BASE) MCG/ACT inhaler Inhale 2 puffs into the lungs every 6 (six) hours as needed for wheezing or shortness of breath. 1 Inhaler 2  . aspirin 81 MG tablet Take 81 mg by mouth daily.      . fenofibrate (TRICOR) 145 MG tablet Take 145 mg by mouth daily.     . fish oil-omega-3 fatty acids 1000 MG capsule Take 2 g by mouth daily.      . furosemide (LASIX) 20 MG tablet Take 1 tablet (20 mg total) by mouth daily as needed. 90 tablet 3  . levothyroxine (SYNTHROID, LEVOTHROID) 25 MCG tablet Take 25 mcg by mouth daily.      Marland Kitchen losartan (COZAAR) 100 MG tablet Take 100 mg by  mouth daily.     . metoprolol succinate (TOPROL-XL) 50 MG 24 hr tablet Take 1 tablet (50 mg total) by mouth daily. Take with or immediately following a meal. 90 tablet 3   No facility-administered medications prior to visit.       Review of Systems  Constitutional: Positive for fatigue. Negative for fever, chills and unexpected weight change.  HENT: Negative for congestion, dental problem, ear pain, nosebleeds, postnasal drip, rhinorrhea, sinus pressure, sneezing, sore throat, trouble swallowing and voice change.   Eyes: Negative for visual disturbance.  Respiratory: Positive for cough, shortness of breath and wheezing. Negative for choking.   Cardiovascular: Negative for chest pain and leg swelling.  Gastrointestinal: Negative for vomiting, abdominal pain and diarrhea.  Genitourinary: Negative for difficulty urinating.  Musculoskeletal: Negative  for arthralgias.  Skin: Negative for rash.  Neurological: Negative for tremors, syncope and headaches.  Hematological: Does not bruise/bleed easily.       Objective:   Physical Exam Filed Vitals:   04/22/15 1503 04/23/15 0938  BP: 130/80 130/80  Pulse: 64 64  Height: 5\' 7"  (1.702 m) 5\' 7"  (1.702 m)  Weight: 200 lb 12.8 oz (91.082 kg) 200 lb 12.8 oz (91.082 kg)  SpO2: 97% 97%    Gen: chronically ill appearing, no acute distress HENT: NCAT, OP clear, neck supple without masses Eyes: PERRL, EOMi Lymph: no cervical lymphadenopathy PULM: Crackles bases of lungs, good air movement CV: RRR, systolic murmur noted, no JVD GI: BS+, soft, nontender, no hsm Derm: no rash or skin breakdown MSK: normal bulk and tone Neuro: A&Ox4, CN II-XII intact, strength 5/5 in all 4 extremities Psyche: normal mood and affect  July 2016 point her function testing showed mild restrictive deficit with severe diffusion capacity decreased, no airflow obstruction Echocardiogram June 2016 showed mild biventricular failure      Assessment & Plan:  Shortness of breath She comes complaining of severe shortness of breath today. The differential diagnosis of dyspnea is broad. Objectively for her she has crackles in the bases of her lungs and no wheezing. She is overweight and appears to be deconditioned. Her lung function testing shows a restrictive lung disease but no airflow obstruction, there is also severely limited diffusion capacity. This could be consistent with scarring in the bases of her lungs versus less likely a pulmonary hypertension type syndrome. The pulmonologist to interpret her lung function tests felt that she had air trapping which considering her long smoking history emphysema is possible. Her most recent echocardiogram showed mild bowel ventricular failure.  While CHF likely contributes to her shortness of breath, I do not think the primary pulmonary hypertension is likely. It is very likely that  there is an underlying lung disease.  Plan: High-resolution CT chest Treat this as if there is some emphysema by adding Stiolto temporarily, samples provided Encouraged regular activity and exercise Follow-up 4 weeks or sooner if needed  Chronic systolic congestive heart failure A recent echocardiogram showed evidence of systolic heart failure. She has been taking diuretics and her leg swelling has improved. She only appears mildly volume overloaded today. While I believe that this may be contributing to her dyspnea, I think there probably is an underlying lung disease. That said, if there is no evidence of lung disease on the CT scan of her chest and certainly her exam findings and even the PFT findings could be consistent with CHF. See description above.       Current outpatient prescriptions:  .  albuterol (PROVENTIL HFA;VENTOLIN  HFA) 108 (90 BASE) MCG/ACT inhaler, Inhale 2 puffs into the lungs every 6 (six) hours as needed for wheezing or shortness of breath., Disp: 1 Inhaler, Rfl: 2 .  aspirin 81 MG tablet, Take 81 mg by mouth daily.  , Disp: , Rfl:  .  fenofibrate (TRICOR) 145 MG tablet, Take 145 mg by mouth daily. , Disp: , Rfl:  .  fish oil-omega-3 fatty acids 1000 MG capsule, Take 2 g by mouth daily.  , Disp: , Rfl:  .  furosemide (LASIX) 20 MG tablet, Take 1 tablet (20 mg total) by mouth daily as needed., Disp: 90 tablet, Rfl: 3 .  levothyroxine (SYNTHROID, LEVOTHROID) 25 MCG tablet, Take 25 mcg by mouth daily.  , Disp: , Rfl:  .  losartan (COZAAR) 100 MG tablet, Take 100 mg by mouth daily. , Disp: , Rfl:  .  metoprolol succinate (TOPROL-XL) 50 MG 24 hr tablet, Take 1 tablet (50 mg total) by mouth daily. Take with or immediately following a meal., Disp: 90 tablet, Rfl: 3

## 2015-04-22 NOTE — Assessment & Plan Note (Addendum)
A recent echocardiogram showed evidence of systolic heart failure. She has been taking diuretics and her leg swelling has improved. She only appears mildly volume overloaded today. While I believe that this may be contributing to her dyspnea, I think there probably is an underlying lung disease. That said, if there is no evidence of lung disease on the CT scan of her chest and certainly her exam findings and even the PFT findings could be consistent with CHF. See description above.

## 2015-04-22 NOTE — Assessment & Plan Note (Signed)
She comes complaining of severe shortness of breath today. The differential diagnosis of dyspnea is broad. Objectively for her she has crackles in the bases of her lungs and no wheezing. She is overweight and appears to be deconditioned. Her lung function testing shows a restrictive lung disease but no airflow obstruction, there is also severely limited diffusion capacity. This could be consistent with scarring in the bases of her lungs versus less likely a pulmonary hypertension type syndrome. The pulmonologist to interpret her lung function tests felt that she had air trapping which considering her long smoking history emphysema is possible. Her most recent echocardiogram showed mild bowel ventricular failure.  While CHF likely contributes to her shortness of breath, I do not think the primary pulmonary hypertension is likely. It is very likely that there is an underlying lung disease.  Plan: High-resolution CT chest Treat this as if there is some emphysema by adding Stiolto temporarily, samples provided Encouraged regular activity and exercise Follow-up 4 weeks or sooner if needed

## 2015-04-23 ENCOUNTER — Encounter: Payer: Self-pay | Admitting: Pulmonary Disease

## 2015-04-27 ENCOUNTER — Telehealth: Payer: Self-pay

## 2015-04-27 DIAGNOSIS — J984 Other disorders of lung: Secondary | ICD-10-CM

## 2015-04-27 NOTE — Telephone Encounter (Signed)
-----   Message from Lupita Leash, MD sent at 04/22/2015  5:39 PM EDT ----- A, Please let her know that I saw her PFT.  She does not have COPD. She needs a CT scan to see if she has scarring in her lungs.  Please order HRCT, Entrikin or Bleitz to read please; reason: restrictive lung disease. Thanks B

## 2015-04-27 NOTE — Telephone Encounter (Signed)
Spoke with pt's friend Dennie Bible (dpr on file), aware of results/recs.  Will make pt aware.  Ct ordered.  Nothing further needed at this time.

## 2015-05-01 ENCOUNTER — Ambulatory Visit (HOSPITAL_COMMUNITY)
Admission: RE | Admit: 2015-05-01 | Discharge: 2015-05-01 | Disposition: A | Discharge status: Home / Self Care | Payer: Medicare Other | Source: Ambulatory Visit | Attending: Pulmonary Disease | Admitting: Pulmonary Disease

## 2015-05-01 DIAGNOSIS — I2510 Atherosclerotic heart disease of native coronary artery without angina pectoris: Secondary | ICD-10-CM | POA: Diagnosis not present

## 2015-05-01 DIAGNOSIS — R05 Cough: Secondary | ICD-10-CM | POA: Insufficient documentation

## 2015-05-01 DIAGNOSIS — R0602 Shortness of breath: Secondary | ICD-10-CM

## 2015-05-01 DIAGNOSIS — Z87891 Personal history of nicotine dependence: Secondary | ICD-10-CM | POA: Diagnosis not present

## 2015-05-01 DIAGNOSIS — J984 Other disorders of lung: Secondary | ICD-10-CM

## 2015-05-11 ENCOUNTER — Telehealth: Payer: Self-pay | Admitting: Pulmonary Disease

## 2015-05-11 MED ORDER — TIOTROPIUM BROMIDE-OLODATEROL 2.5-2.5 MCG/ACT IN AERS: 2.0000 | INHALATION_SPRAY | Freq: Every day | RESPIRATORY_TRACT | Status: AC

## 2015-05-11 NOTE — Telephone Encounter (Signed)
Patient's caregiver called, patient was given Stiolto at [facility] and needs refill on the Stiolto. Rx sent to pharmacy.\ Caregiver notified.

## 2015-05-12 ENCOUNTER — Telehealth: Payer: Self-pay | Admitting: Pulmonary Disease

## 2015-05-12 NOTE — Telephone Encounter (Signed)
Patients friend, Alexia Freestone (on [place]) notified of CT results. Nothing further needed.

## 2015-05-20 ENCOUNTER — Ambulatory Visit: Payer: Medicare Other | Admitting: Cardiology

## 2015-05-22 ENCOUNTER — Ambulatory Visit (INDEPENDENT_AMBULATORY_CARE_PROVIDER_SITE_OTHER): Payer: Medicare Other | Admitting: Pulmonary Disease

## 2015-05-22 ENCOUNTER — Encounter: Payer: Self-pay | Admitting: Pulmonary Disease

## 2015-05-22 VITALS — BP 130/70 | HR 75 | Ht 67.0 in | Wt 203.0 lb

## 2015-05-22 DIAGNOSIS — Z720 Tobacco use: Secondary | ICD-10-CM | POA: Diagnosis not present

## 2015-05-22 DIAGNOSIS — I2510 Atherosclerotic heart disease of native coronary artery without angina pectoris: Secondary | ICD-10-CM

## 2015-05-22 DIAGNOSIS — J432 Centrilobular emphysema: Secondary | ICD-10-CM

## 2015-05-22 DIAGNOSIS — I5022 Chronic systolic (congestive) heart failure: Secondary | ICD-10-CM | POA: Diagnosis not present

## 2015-05-22 DIAGNOSIS — Z87891 Personal history of nicotine dependence: Secondary | ICD-10-CM | POA: Diagnosis not present

## 2015-05-22 MED ORDER — NICOTINE 10 MG IN INHA: 1.0000 | RESPIRATORY_TRACT | Status: AC | PRN

## 2015-05-22 NOTE — Patient Instructions (Signed)
Quit smoking Use the nicotrol inhaler to help quit smoking Take Stiolto every day no matter how you feel We will see you back in 4 months or sooner if needed

## 2015-05-22 NOTE — Assessment & Plan Note (Addendum)
Counseled to quit She wants to try the nicotrol inhaler Discussed lung cancer screening, will place lung cancer screening consult

## 2015-05-22 NOTE — Assessment & Plan Note (Signed)
She has normal lung funciton but she has emphysema on her CT scan.  She has done well with Stiolto.  She really needs to quit smoking.  Plan: Refuses flu shot Stiolto daily F/u 4 months

## 2015-05-22 NOTE — Progress Notes (Signed)
Subjective:    Patient ID: Michelle Daniels, female    DOB: [birth year], 69 y.o.   MRN: [id number] Synopsis:  Referred for dyspnea in 2016, has emphysema June 2016 echocardiogram showing LVEF of 40-45%, mildly reduced RV function July 2016 pulmonary function testing ratio 76%, flow volume loop not consistent with obstruction, FEV1 1.25 L (45% per day to), total lung capacity 4.47 L (79% predicted) to see, DLCO 13.49 (45% predicted) 05/07/2015 CT chest > emphysema and bronchial wall thickening, no scarring, 3 vessel CAD HPI Chief Complaint  Patient presents with  . Follow-up    review HRCT.  pt c/o stable SOB, worse with exertion.  also notes sometimes prod cough with white mucus.     She is still dyspneic. She is getting ready to move and the new place woan't allow her to smoke. She thinks that the Stiolto really helped a lot. She has been coughing less. She hasn't tried to walk since the last visit.  She hasn't walked much.   Past Medical History  Diagnosis Date  . Coronary artery disease     s/p MI and prior PCI (bare metal stent RCA and DES in LAD)  . COPD (chronic obstructive pulmonary disease)   . Hypertension   . Dyslipidemia   . GERD (gastroesophageal reflux disease)       Review of Systems     Objective:   Physical Exam Filed Vitals:   05/22/15 1415  BP: 130/70  Pulse: 75  Height:  (1.702 m)  Weight: 203 lb (92.08 kg)  SpO2: 95%   Gen: well appearing HENT: OP clear, TM's clear, neck supple PULM: CTA B, normal percussion CV: RRR, no mgr, trace edema GI: BS+, soft, nontender Derm: no cyanosis or rash Psyche: normal mood and affect Neuro: slurred speech, somewhat inattentive      Assessment & Plan:  Centrilobular emphysema She has normal lung funciton but she has emphysema on her CT scan.  She has done well with Stiolto.  She really needs to quit smoking.  Plan: Refuses flu shot Stiolto daily F/u 4 months   Tobacco abuse Counseled to quit She  wants to try the nicotrol inhaler Discussed lung cancer screening, will place lung cancer screening consult    Current outpatient prescriptions:  .  albuterol (PROVENTIL HFA;VENTOLIN HFA) 108 (90 BASE) MCG/ACT inhaler, Inhale 2 puffs into the lungs every 6 (six) hours as needed for wheezing or shortness of breath., Disp: 1 Inhaler, Rfl: 2 .  aspirin 81 MG tablet, Take 81 mg by mouth daily.  , Disp: , Rfl:  .  fenofibrate (TRICOR) 145 MG tablet, Take 145 mg by mouth daily. , Disp: , Rfl:  .  fish oil-omega-3 fatty acids 1000 MG capsule, Take 2 g by mouth daily.  , Disp: , Rfl:  .  furosemide (LASIX) 20 MG tablet, Take 1 tablet (20 mg total) by mouth daily as needed., Disp: 90 tablet, Rfl: 3 .  losartan (COZAAR) 100 MG tablet, Take 100 mg by mouth daily. , Disp: , Rfl:  .  metoprolol succinate (TOPROL-XL) 50 MG 24 hr tablet, Take 1 tablet (50 mg total) by mouth daily. Take with or immediately following a meal., Disp: 90 tablet, Rfl: 3 .  Tiotropium Bromide-Olodaterol (STIOLTO RESPIMAT) 2.5-2.5 MCG/ACT AERS, Inhale 2 puffs into the lungs daily., Disp: 1 Inhaler, Rfl: 1 .  levothyroxine (SYNTHROID, LEVOTHROID) 25 MCG tablet, Take 25 mcg by mouth daily.  , Disp: , Rfl:  .  nicotine (NICOTROL)  10 MG inhaler, Inhale 1 cartridge (1 continuous puffing total) into the lungs as needed for smoking cessation., Disp: 42 each, Rfl: 5

## 2015-05-25 ENCOUNTER — Other Ambulatory Visit: Payer: Self-pay | Admitting: Acute Care

## 2015-05-25 DIAGNOSIS — F17210 Nicotine dependence, cigarettes, uncomplicated: Principal | ICD-10-CM

## 2015-05-26 ENCOUNTER — Encounter: Payer: Self-pay | Admitting: Acute Care

## 2015-05-26 ENCOUNTER — Telehealth: Payer: Self-pay | Admitting: Pulmonary Disease

## 2015-05-26 ENCOUNTER — Ambulatory Visit (HOSPITAL_COMMUNITY): Admission: RE | Admit: 2015-05-26 | Payer: Self-pay | Source: Ambulatory Visit

## 2015-05-26 ENCOUNTER — Other Ambulatory Visit: Payer: Self-pay | Admitting: Acute Care

## 2015-05-26 ENCOUNTER — Ambulatory Visit (INDEPENDENT_AMBULATORY_CARE_PROVIDER_SITE_OTHER): Payer: Medicare Other | Admitting: Acute Care

## 2015-05-26 ENCOUNTER — Ambulatory Visit (HOSPITAL_COMMUNITY): Admission: RE | Admit: 2015-05-26 | Payer: Medicare Other | Source: Ambulatory Visit

## 2015-05-26 DIAGNOSIS — F17210 Nicotine dependence, cigarettes, uncomplicated: Secondary | ICD-10-CM | POA: Diagnosis not present

## 2015-05-26 NOTE — Telephone Encounter (Signed)
Spoke with [facility] .  They requested order be faxed for pt's CT scan she is having done this afternoon.  Spoke with Kandice Robinsons and she will call them to see what is needed.

## 2015-05-26 NOTE — Progress Notes (Signed)
Shared Decision Making Visit Lung Cancer Screening Program [phone])   Eligibility:  Age 69 y.o.  Pack Years Smoking History Calculation: 90 pack year smoker (# packs/per year x # years smoked)  Recent History of coughing up blood  no  Unexplained weight loss? no ( >Than 15 pounds within the last 6 months )  Prior History Lung / other cancer no (Diagnosis within the last 5 years already requiring surveillance chest CT Scans).  Smoking Status Current Smoker  Former Smokers: Years since quit: NA  Quit Date: NA  Visit Components:  Discussion included one or more decision making aids. yes  Discussion included risk/benefits of screening. yes  Discussion included potential follow up diagnostic testing for abnormal scans. yes  Discussion included meaning and risk of over diagnosis. yes  Discussion included meaning and risk of False Positives. yes  Discussion included meaning of total radiation exposure. yes  Counseling Included:  Importance of adherence to annual lung cancer LDCT screening. yes  Impact of comorbidities on ability to participate in the program. yes  Ability and willingness to under diagnostic treatment. yes  Smoking Cessation Counseling:  Current Smokers:   Discussed importance of smoking cessation. yes  Information about tobacco cessation classes and interventions provided to patient. yes  Patient provided with "ticket" for LDCT Scan. yes  Symptomatic Patient. no  Counseling:NA  Diagnosis Code: Tobacco Use Z72.0  Asymptomatic Patient yes  Counseling (Intermediate counseling: > three minutes counseling) Z3086  Former Smokers: NA  Discussed the importance of maintaining cigarette abstinence. {NA  Diagnosis Code: Personal History of Nicotine Dependence. V78.469  Information about tobacco cessation classes and interventions provided to patient. NA  Patient provided with "ticket" for LDCT Scan. yes  Written Order for Lung Cancer Screening  with LDCT placed in Epic. Yes (CT Chest Lung Cancer Screening Low Dose W/O CM) [id number] Z12.2-Screening of respiratory organs Z87.891-Personal history of nicotine dependence   I spent 15 minutes of face to face time discussing the lung cancer screening program with Michelle Daniels. We viewed a power point together , pausing at intervals to allow for questions to be asked and answered to ensure full understanding of the above topics. We discussed that the most powerful thing she can do to decrease her risk of lung cancer is to stop smoking. She is moving to a new residence where smoking is not allowed, and is actively working on smoking cessation. She verbalized understanding , and knows to call if she needs assistance with nicotine replacement or medication.Dr.McQuaid has spoken with her also, and has provided her with nicotine replacement options for smoking cessation. She has had a recent ( 05/01/15) high definition CT scan, and we will communicate to a lung cancer screening program near where she is relocating the timing for the next scan, most likely in 12 months. Dr. Llana Aliment is reviewing the scan to determine the timing of the next scan. I have provided her with the quit smart and [employer] Resource numbers , and she also has my card and contact information. I have told her to call me if there is anything I can do to help. She verbalized understanding of all of the above, and had no further questions upon leaving the office.  Michelle Ngo, NP

## 2015-05-29 ENCOUNTER — Telehealth: Payer: Self-pay | Admitting: *Deleted

## 2015-05-29 NOTE — Telephone Encounter (Signed)
Initiated PA for Nicotrol thru [employer]. Key: [id number] Submitted for review. Will await response.

## 2015-06-01 ENCOUNTER — Telehealth: Payer: Self-pay | Admitting: Acute Care

## 2015-06-01 NOTE — Telephone Encounter (Signed)
I called and left a message with Ms. Dhingra asking that she call me with her daughter's phone number so that I can give her the name of a Lung Cancer Screening program in [facility] ( where the patient is moving within the next few weeks) to allow for continuity of care and continued screening.I will await her return call.

## 2015-06-01 NOTE — Telephone Encounter (Signed)
Received approval for Nicotrol. Valid thru 08/29/15. Ref# [id number] Pharmacy informed.

## 2015-06-09 ENCOUNTER — Ambulatory Visit: Payer: Medicare Other | Admitting: Cardiology

## 2023-03-30 DEATH — deceased
